# Patient Record
Sex: Female | Born: 1970
Health system: Southern US, Community
[De-identification: ages and names within clinical notes are randomized; demographics above are authoritative.]

## PROBLEM LIST (undated history)

## (undated) DIAGNOSIS — L309 Dermatitis, unspecified: Secondary | ICD-10-CM

## (undated) DIAGNOSIS — J45909 Unspecified asthma, uncomplicated: Secondary | ICD-10-CM

## (undated) DIAGNOSIS — Z975 Presence of (intrauterine) contraceptive device: Secondary | ICD-10-CM

## (undated) DIAGNOSIS — T7840XA Allergy, unspecified, initial encounter: Secondary | ICD-10-CM

## (undated) HISTORY — DX: Unspecified asthma, uncomplicated: J45.909

## (undated) HISTORY — DX: Allergy, unspecified, initial encounter: T78.40XA

## (undated) HISTORY — DX: Presence of (intrauterine) contraceptive device: Z97.5

## (undated) HISTORY — PX: COLONOSCOPY: SHX174

## (undated) HISTORY — DX: Dermatitis, unspecified: L30.9

---

## 2001-02-05 ENCOUNTER — Inpatient Hospital Stay (HOSPITAL_COMMUNITY): Admission: AD | Admit: 2001-02-05 | Discharge: 2001-02-07 | Payer: Self-pay | Admitting: Obstetrics & Gynecology

## 2001-07-21 ENCOUNTER — Other Ambulatory Visit: Admission: RE | Admit: 2001-07-21 | Discharge: 2001-07-21 | Payer: Self-pay | Admitting: Obstetrics & Gynecology

## 2002-07-26 ENCOUNTER — Other Ambulatory Visit: Admission: RE | Admit: 2002-07-26 | Discharge: 2002-07-26 | Payer: Self-pay | Admitting: Obstetrics & Gynecology

## 2003-06-11 ENCOUNTER — Inpatient Hospital Stay (HOSPITAL_COMMUNITY): Admission: AD | Admit: 2003-06-11 | Discharge: 2003-06-11 | Payer: Self-pay | Admitting: Obstetrics & Gynecology

## 2003-06-19 ENCOUNTER — Inpatient Hospital Stay (HOSPITAL_COMMUNITY): Admission: AD | Admit: 2003-06-19 | Discharge: 2003-06-23 | Payer: Self-pay | Admitting: Obstetrics and Gynecology

## 2003-06-24 ENCOUNTER — Encounter: Admission: RE | Admit: 2003-06-24 | Discharge: 2003-07-24 | Payer: Self-pay | Admitting: Obstetrics & Gynecology

## 2003-07-19 ENCOUNTER — Other Ambulatory Visit: Admission: RE | Admit: 2003-07-19 | Discharge: 2003-07-19 | Payer: Self-pay | Admitting: Obstetrics & Gynecology

## 2003-07-25 ENCOUNTER — Encounter: Admission: RE | Admit: 2003-07-25 | Discharge: 2003-08-24 | Payer: Self-pay | Admitting: Obstetrics & Gynecology

## 2003-09-24 ENCOUNTER — Encounter: Admission: RE | Admit: 2003-09-24 | Discharge: 2003-10-24 | Payer: Self-pay | Admitting: Obstetrics & Gynecology

## 2004-07-22 ENCOUNTER — Other Ambulatory Visit: Admission: RE | Admit: 2004-07-22 | Discharge: 2004-07-22 | Payer: Self-pay | Admitting: Obstetrics & Gynecology

## 2005-07-29 ENCOUNTER — Other Ambulatory Visit: Admission: RE | Admit: 2005-07-29 | Discharge: 2005-07-29 | Payer: Self-pay | Admitting: Obstetrics & Gynecology

## 2013-03-22 ENCOUNTER — Other Ambulatory Visit: Payer: Self-pay | Admitting: Family Medicine

## 2013-03-22 DIAGNOSIS — R221 Localized swelling, mass and lump, neck: Secondary | ICD-10-CM

## 2013-03-23 ENCOUNTER — Ambulatory Visit
Admission: RE | Admit: 2013-03-23 | Discharge: 2013-03-23 | Disposition: A | Discharge status: Home / Self Care | Payer: BC Managed Care – PPO | Source: Ambulatory Visit | Attending: Family Medicine | Admitting: Family Medicine

## 2013-03-23 DIAGNOSIS — R220 Localized swelling, mass and lump, head: Secondary | ICD-10-CM

## 2013-04-05 ENCOUNTER — Ambulatory Visit (INDEPENDENT_AMBULATORY_CARE_PROVIDER_SITE_OTHER): Payer: BC Managed Care – PPO | Admitting: Internal Medicine

## 2013-04-05 DIAGNOSIS — Z23 Encounter for immunization: Secondary | ICD-10-CM

## 2013-04-05 DIAGNOSIS — Z7184 Encounter for health counseling related to travel: Secondary | ICD-10-CM | POA: Insufficient documentation

## 2013-04-05 DIAGNOSIS — Z7189 Other specified counseling: Secondary | ICD-10-CM

## 2013-04-05 MED ORDER — CHLOROQUINE PHOSPHATE 500 MG PO TABS: 500.0000 mg | ORAL_TABLET | Freq: Every day | ORAL | Status: DC

## 2013-04-05 MED ORDER — CIPROFLOXACIN HCL 500 MG PO TABS: 500.0000 mg | ORAL_TABLET | Freq: Two times a day (BID) | ORAL | Status: DC

## 2013-04-05 NOTE — Progress Notes (Signed)
  Subjective:    Cindy Gross is a 42 y.o. female who presents to the Infectious Disease clinic for travel consultation. Planned departure date: Aug 9          Planned return date: Aug 16 Countries of travel: [place] Areas in country: rural   Accommodations: dorm like setting Purpose of travel: missionary work Prior travel out of [place]: yes Currently ill / Fever: no History of liver or kidney disease: no  Data Review:  vaccines reviewed with her   Review of Systems Hematologic/lymphatic: negative, n/a n/a    Objective:    n/a    Assessment:    No contraindications to travel. None       Plan:    Issues discussed: future shots, insect-borne illnesses, malaria, motion sickness, MVA safety, rabies, safe food/water, traveler's diarrhea, website/handouts for more information, what to do if ill upon return, what to do if ill while there and [place]. Immunizations recommended: Typhoid (parenteral) and none. Malaria prophylaxis: chloroquine given, moderate risk area Traveler's diarrhea prophylaxis: ciprofloxacin.

## 2014-01-30 LAB — HM MAMMOGRAPHY

## 2014-01-30 LAB — HM PAP SMEAR

## 2014-04-04 ENCOUNTER — Ambulatory Visit (INDEPENDENT_AMBULATORY_CARE_PROVIDER_SITE_OTHER): Payer: 59 | Admitting: Family Medicine

## 2014-04-04 ENCOUNTER — Encounter: Payer: Self-pay | Admitting: Family Medicine

## 2014-04-04 VITALS — BP 102/70 | HR 77 | Temp 98.4°F | Ht 61.0 in | Wt 114.5 lb

## 2014-04-04 DIAGNOSIS — J45990 Exercise induced bronchospasm: Secondary | ICD-10-CM

## 2014-04-04 DIAGNOSIS — Z7189 Other specified counseling: Secondary | ICD-10-CM

## 2014-04-04 MED ORDER — LEVALBUTEROL TARTRATE 45 MCG/ACT IN AERO: 1.0000 | INHALATION_SPRAY | RESPIRATORY_TRACT | Status: DC | PRN

## 2014-04-04 NOTE — Patient Instructions (Signed)
I would get a flu shot each fall.   Take care.  Glad to see you.  We'll get your records in the meantime.

## 2014-04-04 NOTE — Progress Notes (Signed)
Pre visit review using our clinic review tool, if applicable. No additional management support is needed unless otherwise documented below in the visit note.  New patient to est care.  Very mild exercise induced asthma, rare SABA use.  Good effect, needs a refill.  Feels well o/w.  No complaints.   tdap 2014 at work  Flu yearly Requesting records.   Has a living will.  Husband, then brother designated if patient were incapacitated.   PMH and SH reviewed  Meds, vitals, and allergies reviewed.   ROS: See HPI.  Otherwise negative.    GEN: nad, alert and oriented HEENT: mucous membranes moist NECK: supple w/o LA CV: rrr. PULM: ctab, no inc wob ABD: +bs EXT: no edema

## 2014-04-05 ENCOUNTER — Encounter: Payer: Self-pay | Admitting: Family Medicine

## 2014-04-05 DIAGNOSIS — J45990 Exercise induced bronchospasm: Secondary | ICD-10-CM | POA: Insufficient documentation

## 2014-04-05 DIAGNOSIS — Z7189 Other specified counseling: Secondary | ICD-10-CM | POA: Insufficient documentation

## 2014-04-05 NOTE — Assessment & Plan Note (Signed)
Rare sx, continue prn xopenex, rx sent.  Requesting records.  Fu prn.  Flu shot in the fall.

## 2014-08-22 ENCOUNTER — Ambulatory Visit (INDEPENDENT_AMBULATORY_CARE_PROVIDER_SITE_OTHER): Payer: 59 | Admitting: Family Medicine

## 2014-08-22 ENCOUNTER — Encounter: Payer: Self-pay | Admitting: Family Medicine

## 2014-08-22 VITALS — BP 102/68 | HR 84 | Temp 98.4°F | Wt 115.5 lb

## 2014-08-22 DIAGNOSIS — R51 Headache: Secondary | ICD-10-CM

## 2014-08-22 DIAGNOSIS — R519 Headache, unspecified: Secondary | ICD-10-CM

## 2014-08-22 NOTE — Patient Instructions (Signed)
Rotate your mattress and try a different pillow.  Let me know if that doesn't help.  Take care. Glad to see you.

## 2014-08-22 NOTE — Progress Notes (Signed)
Pre visit review using our clinic review tool, if applicable. No additional management support is needed unless otherwise documented below in the visit note.  Sx started on 08/13/14.  She woke up at 4:15 with a HA.  She usually doesn't have HAs. This was atypical for her.  She took 2 advil.   She got out of bed and sat in a chair for a while.  She got better after about 30 minutes.  Had a normal day w/o pain, other than being slightly tired from getting up early.  She had another episodes at 4:15 AM on 08/14/14, another HA like the day prev.  HA better/resolved after taking advil.   She has had recurrent HA every AM since then, between 4 and 5 AM.  No other neuro changes.  No vision or gait changes.  No aura, no photophobia.   The HA starts at the R occiput and radiates through her head to the R eye brow.  It doesn't radiate around the scalp.   She hasn't felt likely running but o/w has been doing her regular activities.   Sunday 08/20/14 AM was the worst HA.  She hasn't changed pillows.  No trauma.  No caffeine.  No new changes (ie starting or stopping anything) to cause it.  Meds, vitals, and allergies reviewed.   ROS: See HPI.  Otherwise, noncontributory.  GEN: nad, alert and oriented HEENT: mucous membranes moist, tm wnl, nasal and OP exam wnl NECK: supple w/o LA, not ttp but the site of pain prev is at the R occipital ridge.  CV: rrr.  PULM: ctab, no inc wob EXT: no edema SKIN: no acute rash CN 2-12 wnl B, S/S/DTR wnl x4 Benign nondilated fundus exam B, PERRL

## 2014-08-23 DIAGNOSIS — R51 Headache: Secondary | ICD-10-CM

## 2014-08-23 DIAGNOSIS — R519 Headache, unspecified: Secondary | ICD-10-CM | POA: Insufficient documentation

## 2014-08-23 NOTE — Assessment & Plan Note (Signed)
D/w pt.  No sign of ominous or intracranial pathology.  Benign exam.  Likely occipital trigger, ie occipital neuralgia.  Less likely from neck OA.  We can consider neck films or CT head but in meantime she'll try changing her pillow and rotate her mattress.  She agrees and she'll call back to update me.

## 2015-12-30 ENCOUNTER — Other Ambulatory Visit: Payer: Self-pay | Admitting: Family Medicine

## 2015-12-30 DIAGNOSIS — Z8349 Family history of other endocrine, nutritional and metabolic diseases: Secondary | ICD-10-CM

## 2015-12-30 DIAGNOSIS — Z131 Encounter for screening for diabetes mellitus: Secondary | ICD-10-CM

## 2015-12-30 DIAGNOSIS — Z13220 Encounter for screening for lipoid disorders: Secondary | ICD-10-CM

## 2016-01-01 ENCOUNTER — Other Ambulatory Visit: Payer: 59

## 2016-01-01 ENCOUNTER — Other Ambulatory Visit (INDEPENDENT_AMBULATORY_CARE_PROVIDER_SITE_OTHER): Payer: 59

## 2016-01-01 DIAGNOSIS — Z131 Encounter for screening for diabetes mellitus: Secondary | ICD-10-CM

## 2016-01-01 DIAGNOSIS — Z8349 Family history of other endocrine, nutritional and metabolic diseases: Secondary | ICD-10-CM | POA: Diagnosis not present

## 2016-01-01 DIAGNOSIS — Z13220 Encounter for screening for lipoid disorders: Secondary | ICD-10-CM | POA: Diagnosis not present

## 2016-01-01 LAB — GLUCOSE, RANDOM: Glucose, Bld: 98 mg/dL (ref 70–99)

## 2016-01-01 LAB — LIPID PANEL
CHOL/HDL RATIO: 2
Cholesterol: 156 mg/dL (ref 0–200)
HDL: 72.7 mg/dL (ref >39.00)
LDL CALC: 74 mg/dL (ref 0–99)
NonHDL: 83.78
TRIGLYCERIDES: 51 mg/dL (ref 0.0–149.0)
VLDL: 10.2 mg/dL (ref 0.0–40.0)

## 2016-01-01 LAB — TSH: TSH: 1.06 u[IU]/mL (ref 0.35–4.50)

## 2016-01-08 ENCOUNTER — Encounter: Payer: 59 | Admitting: Family Medicine

## 2016-01-18 ENCOUNTER — Encounter: Payer: 59 | Admitting: Family Medicine

## 2016-01-18 ENCOUNTER — Ambulatory Visit (INDEPENDENT_AMBULATORY_CARE_PROVIDER_SITE_OTHER): Payer: 59 | Admitting: Family Medicine

## 2016-01-18 ENCOUNTER — Encounter: Payer: Self-pay | Admitting: Family Medicine

## 2016-01-18 VITALS — BP 102/70 | HR 74 | Temp 98.5°F | Ht 61.0 in | Wt 116.0 lb

## 2016-01-18 DIAGNOSIS — Z0000 Encounter for general adult medical examination without abnormal findings: Secondary | ICD-10-CM | POA: Diagnosis not present

## 2016-01-18 DIAGNOSIS — J45990 Exercise induced bronchospasm: Secondary | ICD-10-CM

## 2016-01-18 MED ORDER — LEVALBUTEROL TARTRATE 45 MCG/ACT IN AERO: 1.0000 | INHALATION_SPRAY | RESPIRATORY_TRACT | Status: DC | PRN

## 2016-01-18 NOTE — Patient Instructions (Signed)
Take care.  Glad to see you.  

## 2016-01-18 NOTE — Progress Notes (Signed)
Pre visit review using our clinic review tool, if applicable. No additional management support is needed unless otherwise documented below in the visit note.  CPE- See plan.  Routine anticipatory guidance given to patient.  See health maintenance. tdap 2014 at work  Flu yearly at work PNA and shingles not due Mammogram and pap per gyn.   No due for DXA.   Has a living will. Husband, then brother designated if patient were incapacitated.   Working out and good diet.  Colon cancer screening due at 53.   HIV prev done with prenatal labs in 2003.   Very mild exercise induced asthma, rare SABA use. Good effect, needs a refill. Feels well o/w. No complaints.   PMH and SH reviewed  Meds, vitals, and allergies reviewed.   ROS: Per HPI.  Unless specifically indicated otherwise in HPI, the patient denies:  General: fever. Eyes: acute vision changes ENT: sore throat Cardiovascular: chest pain Respiratory: SOB GI: vomiting GU: dysuria Musculoskeletal: acute back pain Derm: acute rash Neuro: acute motor dysfunction Psych: worsening mood Endocrine: polydipsia Heme: bleeding Allergy: hayfever  GEN: nad, alert and oriented HEENT: mucous membranes moist NECK: supple w/o LA CV: rrr. PULM: ctab, no inc wob ABD: soft, +bs EXT: no edema SKIN: no acute rash

## 2016-01-20 DIAGNOSIS — Z0000 Encounter for general adult medical examination without abnormal findings: Secondary | ICD-10-CM | POA: Insufficient documentation

## 2016-01-20 NOTE — Assessment & Plan Note (Signed)
Continue prn SABA.  Rare use.

## 2016-01-20 NOTE — Assessment & Plan Note (Signed)
tdap 2014 at work  Flu yearly at work  PNA and shingles not due  Mammogram and pap per gyn.  No due for DXA.  Has a living will. Husband, then brother designated if patient were incapacitated.  Working out and good diet.  Colon cancer screening due at 77.  HIV prev done with prenatal labs in 2003.

## 2016-01-25 ENCOUNTER — Other Ambulatory Visit: Payer: Self-pay | Admitting: *Deleted

## 2016-01-25 DIAGNOSIS — I83892 Varicose veins of left lower extremities with other complications: Secondary | ICD-10-CM

## 2016-04-15 ENCOUNTER — Encounter: Payer: Self-pay | Admitting: Vascular Surgery

## 2016-04-17 ENCOUNTER — Encounter: Payer: Self-pay | Admitting: Vascular Surgery

## 2016-04-17 ENCOUNTER — Ambulatory Visit (HOSPITAL_COMMUNITY)
Admission: RE | Admit: 2016-04-17 | Discharge: 2016-04-17 | Disposition: A | Discharge status: Home / Self Care | Payer: 59 | Source: Ambulatory Visit | Attending: Vascular Surgery | Admitting: Vascular Surgery

## 2016-04-17 ENCOUNTER — Ambulatory Visit (INDEPENDENT_AMBULATORY_CARE_PROVIDER_SITE_OTHER): Payer: 59 | Admitting: Vascular Surgery

## 2016-04-17 VITALS — BP 106/78 | HR 75 | Ht 61.0 in | Wt 116.0 lb

## 2016-04-17 DIAGNOSIS — I83892 Varicose veins of left lower extremities with other complications: Secondary | ICD-10-CM | POA: Diagnosis not present

## 2016-04-17 DIAGNOSIS — I83812 Varicose veins of left lower extremities with pain: Secondary | ICD-10-CM

## 2016-04-17 NOTE — Progress Notes (Signed)
Patient name: Cindy Gross MRN: [id number] DOB: [birth year] Sex: female  REASON FOR CONSULT: Varicose veins with pain and thrombophlebitis  HPI: Cindy Gross is a 45 y.o. 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 Over the last few years she has developed more heaviness aching and fullness in the left lower extremity. This is exacerbated by exercise. She has worn compression stockings with some relief. She states the legs are better if she elevates them at the end of the evening and for the most part are improved by the next morning. 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 Her symptoms become worse with this. She has had 2 episodes of thrombophlebitis in the past year related to this in the left leg. One of these was in December the last one was in March. The patient overall is very active and states and triathlon events and her exercise has been limited by episodes of thrombophlebitis in her left leg. She denies any family history of varicose veins. She has no prior history of DVT. She has no family history of hypercoagulable state. Other medical problems include mild exercise-induced asthma which has been stable.  Past Medical History:  Diagnosis Date  . Asthma    mild exercise induced  . IUD (intrauterine device) in place   . NSVD (normal spontaneous vaginal delivery)    Past Surgical History:  Procedure Laterality Date  . CESAREAN SECTION      Family History  Problem Relation Age of Onset  . Hypertension Mother   . Thyroid disease Mother   . Heart disease Father     cabg  . Hyperlipidemia Father   . Hypertension Father   . Breast cancer Paternal Grandmother   . Colon cancer Neg Hx     SOCIAL HISTORY: Social History   Social History  . Marital status: Married    Spouse name: N/A  . Number of children: N/A  . Years of education: N/A   Occupational History  . Not on file.   Social History  Main Topics  . Smoking status: Never Smoker  . Smokeless tobacco: Never Used  . Alcohol use No  . Drug use: No  . Sexual activity: Not on file   Other Topics Concern  . 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    Has done overseas mission work    No Known Allergies  Current Outpatient Prescriptions  Medication Sig Dispense Refill  . levalbuterol (XOPENEX HFA) 45 MCG/ACT inhaler Inhale 1-2 puffs into the lungs every 4 (four) hours as needed for wheezing or shortness of breath. (Patient not taking: Reported on 04/17/2016) 1 Inhaler 12   No current facility-administered medications for this visit.     ROS:   General:  No weight loss, Fever, chills  HEENT: No recent headaches, no nasal bleeding, no visual changes, no sore throat  Neurologic: No dizziness, blackouts, seizures. No recent symptoms of stroke or mini- stroke. No recent episodes of slurred speech, or temporary blindness.  Cardiac: No recent episodes of chest pain/pressure, no shortness of breath at rest.  No shortness of breath with exertion.  Denies history of atrial fibrillation or irregular heartbeat  Vascular:  No history of rest pain in feet.  No history of claudication.  No history of non-healing ulcer, No history of DVT   Pulmonary: No home oxygen, no productive cough, no hemoptysis,  No asthma or wheezing  Musculoskeletal:  [ ]  Arthritis, [ ]  Low back pain,  [ ]  Joint pain  Hematologic:No history of hypercoagulable state.  No history of easy bleeding.  No history of anemia  Gastrointestinal: No hematochezia or melena,  No gastroesophageal reflux, no trouble swallowing  Urinary: [ ]  chronic Kidney disease, [ ]  on HD - [ ]  MWF or [ ]  TTHS, [ ]  Burning with urination, [ ]  Frequent urination, [ ]  Difficulty urinating;     Skin: No rashes  Psychological: No history of anxiety,  No history of depression   Physical Examination  Vitals:   04/17/16 0926  BP: 106/78  Pulse: 75  SpO2: 100%  Weight: 116 lb (52.6 kg)  Height: 5\' 1"  (1.549 m)    Body mass index is 21.92 kg/m.  General:  Alert and oriented, no acute distress HEENT: Normal Neck: No bruit or JVD Pulmonary: Clear to auscultation bilaterally Cardiac: Regular Rate and Rhythm without murmur Abdomen: Soft, non-tender, non-distended, no mass Skin: No rash, large cluster of varicosities over the left pretibial region which is the reason she has had thrombophlebitis in several times. Veins very in diameter from 2-4 mm. There are several clusters of reticular and spider-type varicosities surrounding these as well. Extremity Pulses:  2+ radial, brachial, femoral, dorsalis pedis, posterior tibial pulses bilaterally Musculoskeletal: No deformity or edema  Neurologic: Upper and lower extremity motor 5/5 and symmetric  DATA:  Patient had a left lower extremity venous duplex exam which showed diffuse reflux throughout the entire left greater saphenous vein and saphenofemoral junction. She had a competent deep veins. Vein diameter was 4-8 mm.  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   PLAN:  #1 are prescribed and increasing compression for the patient's lower extremities going to 30 mmHg today. #2 the patient will follow-up in 3 months time for consideration of laser ablation of her left lower extremity.   Ruta Hinds, MD [facility] Office: [phone] Pager: [phone]

## 2016-04-30 LAB — HM PAP SMEAR

## 2016-05-27 ENCOUNTER — Encounter: Payer: Self-pay | Admitting: Family Medicine

## 2016-06-25 ENCOUNTER — Other Ambulatory Visit: Payer: Self-pay

## 2016-06-25 MED ORDER — LEVALBUTEROL TARTRATE 45 MCG/ACT IN AERO: 1.0000 | INHALATION_SPRAY | RESPIRATORY_TRACT | 12 refills | Status: DC | PRN

## 2016-06-25 MED FILL — XOPENEX HFA 45 MCG INHALER: 45 | 30 days supply | Qty: 15 | Fill #0

## 2016-06-25 NOTE — Telephone Encounter (Signed)
Pt left v/m that [facility] outpt pharmacy does not have Xopenex inhaler on file. Spoke with Heidi at [facility] and Medication phoned to [facility] as instructed. Pt voiced understanding.

## 2016-07-22 ENCOUNTER — Ambulatory Visit: Payer: 59 | Admitting: Vascular Surgery

## 2017-03-25 LAB — HM PAP SMEAR

## 2017-03-26 ENCOUNTER — Other Ambulatory Visit: Payer: Self-pay | Admitting: Obstetrics

## 2017-03-26 DIAGNOSIS — N63 Unspecified lump in unspecified breast: Secondary | ICD-10-CM

## 2017-03-27 ENCOUNTER — Other Ambulatory Visit: Payer: Self-pay | Admitting: Obstetrics

## 2017-03-27 DIAGNOSIS — N63 Unspecified lump in unspecified breast: Secondary | ICD-10-CM

## 2017-03-31 ENCOUNTER — Ambulatory Visit
Admission: RE | Admit: 2017-03-31 | Discharge: 2017-03-31 | Disposition: A | Discharge status: Home / Self Care | Payer: Managed Care, Other (non HMO) | Source: Ambulatory Visit | Attending: Obstetrics | Admitting: Obstetrics

## 2017-03-31 DIAGNOSIS — N63 Unspecified lump in unspecified breast: Secondary | ICD-10-CM

## 2017-04-02 ENCOUNTER — Other Ambulatory Visit: Payer: 59

## 2017-04-09 ENCOUNTER — Encounter: Payer: Self-pay | Admitting: Family Medicine

## 2018-08-13 ENCOUNTER — Telehealth: Payer: Self-pay | Admitting: Family Medicine

## 2018-08-13 NOTE — Telephone Encounter (Signed)
Pt called office wanting to schedule a physical with Dr.Duncan. I offered the soonest appointment he had for a CPX which was in March. Pt stated she needed it to be a Wednesday because that's her day off. I explained that Dr.Duncan is out of the office on Wednesday's. She asked if she could see another provider for a physical and I explained that it would need to be her PCP. Pt stated she just might look for a new practice. I did let the pt know I would send a message to Dr.Duncan to see if we could accommodate her in any way and that someone would reach back out. Pt still verbalized that she would look for another practice because she feels just because Dr.Duncan is out of the office on Wednesday doesn't mean she can't see another provider for a physical. Please advise

## 2018-08-15 ENCOUNTER — Encounter: Payer: Self-pay | Admitting: Family Medicine

## 2018-08-15 NOTE — Telephone Encounter (Signed)
This patient is a completely reliable and reasonable person.  I think we should try to offer some accommodation given her work schedule.  I am not in the clinic on Wednesdays.  We usually try to keep physicals with the PCP.  If another provider is willing to schedule her for a physical, then that is one option.  The other option is to schedule it on some other day of the work with me, or have her see a different provider/clinic.  I do not have availability on Wednesdays anymore, unfortunately.  I routed this to Leticia Penna for help/input.  I thank all involved.

## 2018-08-17 NOTE — Telephone Encounter (Signed)
As Dr. Damita Dunnings is no longer here on Wednesdays, patient may want to consider another provider due to her scheduling needs.    We certainly can ask another provider if they would be willing to do her annual exam on a Wednesday, but my concern is where does this leave her moving forward?  If she can only schedule appointments on Wednesdays then we may better support the patient by offering a transfer of care to a provider with a Wed schedule.  Please call patient and discuss her options and ask how she would like to move forward.  If transferring, please review available providers and ask if preference.  Send them and Dr. Damita Dunnings the notice of transfer and once transfer acceptance, then make appointment for patient.  Thanks!

## 2018-08-18 NOTE — Telephone Encounter (Signed)
See below, let me know what you hear.  Either way, I wish this patient the best.  Thanks.

## 2018-08-18 NOTE — Telephone Encounter (Signed)
FYI, transfer to you due to me not being in clinic on Wednesdays.  Kind and reliable patient.  Thanks.

## 2018-08-18 NOTE — Telephone Encounter (Signed)
I spoke to Cindy Gross and explained it is best for Cindy Gross to see pcp for physicals. She said a tranfer of care was fine with her, she values Dr. Damita Dunnings but was willing to transfer for Wed appts. I scheduled her a TOC appt 09/08/18. She is requesting a cpe but was advised this was only if time allows and there were not any issues discussed. Cindy Gross understood.

## 2018-09-08 ENCOUNTER — Ambulatory Visit (INDEPENDENT_AMBULATORY_CARE_PROVIDER_SITE_OTHER): Payer: Managed Care, Other (non HMO) | Admitting: Family Medicine

## 2018-09-08 ENCOUNTER — Encounter: Payer: Self-pay | Admitting: Family Medicine

## 2018-09-08 VITALS — BP 102/74 | HR 87 | Temp 98.3°F | Ht 61.0 in | Wt 122.5 lb

## 2018-09-08 DIAGNOSIS — L309 Dermatitis, unspecified: Secondary | ICD-10-CM | POA: Diagnosis not present

## 2018-09-08 DIAGNOSIS — Z0001 Encounter for general adult medical examination with abnormal findings: Secondary | ICD-10-CM | POA: Diagnosis not present

## 2018-09-08 DIAGNOSIS — Z0000 Encounter for general adult medical examination without abnormal findings: Secondary | ICD-10-CM

## 2018-09-08 NOTE — Progress Notes (Signed)
Annual Exam   Chief Complaint:  Chief Complaint  Patient presents with  . Transfer of Care    from Dr. Damita Dunnings due to availability.  . Annual Exam    does see OBGYN and last visit was in November 2019 with them.    History of Present Illness:  [name] is a 48 y.o. No obstetric history on file. who LMP was No LMP recorded. (Menstrual status: IUD)., presents today for her annual examination.     IUD with no regular cycles  She is single partner, contraception - IUD.   Cancer Screening:  Cervical Cancer Screening:  Last Pap:   November 2019 Results were: no abnormalities /neg HPV DNA Hx of STDs: none  Breast Cancer Screening There is FH of breast cancer. There is no FH of ovarian cancer. BRCA screening Not Indicated.  Screening done by OB/GYN   Nutrition She does get adequate calcium and Vitamin D in her diet. Diet: healthy, vegetarian  Safety The patient wears seatbelts: yes.     The patient feels safe at home and in their relationships: yes.  Weight Wt Readings from Last 3 Encounters:  09/08/18 122 lb 8 oz (55.6 kg)  04/17/16 116 lb (52.6 kg)  01/18/16 116 lb (52.6 kg)   Patient has normal BMI  BMI Readings from Last 1 Encounters:  09/08/18 23.15 kg/m     Chronic disease screening Blood pressure monitoring:  BP Readings from Last 3 Encounters:  09/08/18 102/74  04/17/16 106/78  01/18/16 102/70    Lipid Monitoring: Indication for screening: age >77, obesity, diabetes, family hx, CV risk factors.  Lipid screening: up to date  Lab Results  Component Value Date   CHOL 156 01/01/2016   HDL 72.70 01/01/2016   LDLCALC 74 01/01/2016   TRIG 51.0 01/01/2016   CHOLHDL 2 01/01/2016     Diabetes Screening: overweight, family hx, PCOS, hx of gestational diabetes, at risk ethnicity Diabetes Screening screening: No  No results found for: HGBA1C   Past Medical History:  Diagnosis Date  . Asthma    mild exercise induced  . IUD  (intrauterine device) in place   . NSVD (normal spontaneous vaginal delivery)     Past Surgical History:  Procedure Laterality Date  . CESAREAN SECTION      Prior to Admission medications   Medication Sig Start Date End Date Taking? Authorizing Provider  levalbuterol [facility] HFA) 45 MCG/ACT inhaler Inhale 1-2 puffs into the lungs every 4 (four) hours as needed for wheezing or shortness of breath. 06/25/16   Tonia Ghent, MD    No Known Allergies  Gynecologic History: No LMP recorded. (Menstrual status: IUD).  Obstetric History: No obstetric history on file.  Social History   Socioeconomic History  . Marital status: Married    Spouse name: Cindy Gross  . Number of children: 2  . Years of education: Masters Degree  . Highest education level: Not on file  Occupational History  . Not on file  Social Needs  . Financial resource strain: Not hard at all  . Food insecurity:    Worry: Not on file    Inability: Not on file  . Transportation needs:    Medical: Not on file    Non-medical: Not on file  Tobacco Use  . Smoking status: Never Smoker  . Smokeless tobacco: Never Used  Substance and Sexual Activity  . Alcohol use: Yes    Comment: 1-2 drinks a week  . Drug use: No  .  Sexual activity: Yes    Birth control/protection: I.U.D.  Lifestyle  . Physical activity:    Days per week: Not on file    Minutes per session: Not on file  . Stress: Not on file  Relationships  . Social connections:    Talks on phone: Not on file    Gets together: Not on file    Attends religious service: Not on file    Active member of club or organization: Not on file    Attends meetings of clubs or organizations: Not on file    Relationship status: Not on file  . Intimate partner violence:    Fear of current or ex partner: Not on file    Emotionally abused: Not on file    Physically abused: Not on file    Forced sexual activity: Not on file  Other Topics Concern  . Not on file  Social  History Narrative   Married - Cindy Gross - now permanently disabled    2 sons - Will and Wellsite geologist (Will is graduating high school in 2020   Physician's assistant, [employer]   Exercise: Did 86 triathlons, frequent exercise, half marathon   [employer]. 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 Hypertension Mother        lost weight an no longer needs medicine  . Hypothyroidism Mother   . Heart disease Father        cabg  . Hyperlipidemia Father   . Hypertension Father   . Breast cancer Paternal Grandmother   . Heart failure Paternal Grandmother   . CAD Paternal Grandfather   . Heart attack Paternal Grandfather 81  . Leukemia Paternal Grandfather   . Colon cancer Neg Hx     Review of Systems  Constitutional: Negative for chills and fever.  HENT: Positive for congestion. Negative for sinus pain.   Eyes: Negative for blurred vision and redness.  Respiratory: Positive for cough. Negative for sputum production.   Cardiovascular: Negative for chest pain.  Gastrointestinal: Negative for constipation, diarrhea, heartburn, nausea and vomiting.  Genitourinary: Negative for dysuria and urgency.  Musculoskeletal: Negative for myalgias.  Skin: Negative for rash.  Neurological: Negative for dizziness, weakness and headaches.  Endo/Heme/Allergies: Negative for polydipsia.  Psychiatric/Behavioral: Negative for depression. The patient is not nervous/anxious.      Physical Exam BP 102/74   Pulse 87   Temp 98.3 F (36.8 C)   Ht _0  (1.549 m)   Wt 122 lb 8 oz (55.6 kg)   SpO2 99%   BMI 23.15 kg/m    BP Readings from Last 3 Encounters:  09/08/18 102/74  04/17/16 106/78  01/18/16 102/70      Physical Exam Constitutional:      General: She is not in acute distress.    Appearance: She is  well-developed. She is not diaphoretic.  HENT:     Head: Normocephalic and atraumatic.     Right Ear: External ear normal.     Left Ear: External ear normal.     Nose: Nose normal.  Eyes:     General: No scleral icterus.    Conjunctiva/sclera: Conjunctivae normal.  Neck:     Musculoskeletal: Neck supple.  Cardiovascular:     Rate and Rhythm: Normal rate and regular  rhythm.     Heart sounds: No murmur.  Pulmonary:     Effort: Pulmonary effort is normal. No respiratory distress.     Breath sounds: Normal breath sounds. No wheezing.  Abdominal:     General: Bowel sounds are normal. There is no distension.     Palpations: Abdomen is soft. There is no mass.     Tenderness: There is no abdominal tenderness. There is no guarding or rebound.  Musculoskeletal: Normal range of motion.  Lymphadenopathy:     Cervical: No cervical adenopathy.  Skin:    General: Skin is warm and dry.     Capillary Refill: Capillary refill takes less than 2 seconds.     Comments: Patches of eczema on face  Neurological:     Mental Status: She is alert and oriented to person, place, and time.     Deep Tendon Reflexes: Reflexes normal.  Psychiatric:        Behavior: Behavior normal.        Results: AUDIT Questionnaire (screen for alcoholism):   PHQ-9:     Office Visit from 09/08/2018 in [place] [facility]  PHQ-9 Total Score  5         Assessment: 48 y.o. No obstetric history on file. female here for routine annual physical examination.  Plan: Problem List Items Addressed This Visit      Musculoskeletal and Integument   Severe eczema      Screening: -- Blood pressure screen normal -- Mammogram - up to dated, followed by OB/GYN -- Weight screening: normal -- Depression screening (PHQ-9): positive, in therapy -- Nutrition: normal -- cholesterol screening: not due for screening -- osteoporosis screening: not due -- tobacco screening: not using -- alcohol screening: Low  risk usage -- family history of breast cancer screening: up to date. Low risk -- no evidence of domestic violence or intimate partner violence. -- pap smear per OB/GYN per ASCCP guidelines -- flu vaccine up to date -- TDAP  Up to date -- recent eye exam normal   Lesleigh Noe

## 2019-01-07 ENCOUNTER — Encounter: Payer: Self-pay | Admitting: Family Medicine

## 2019-01-07 NOTE — Telephone Encounter (Signed)
Spoke with patient and discussed that we can do virtual appointment with urine sample drop off prior today if she is able to. Advised that Dr. Einar Pheasant is not here today but anyone else can see patient. Discussed several options of getting urine sample done. Patient verbalized understanding and will call back after looking at her schedule for today, she is working from home and needs to see when can she step out to come to our office. Advised patient that we could let her collect urine sample here when she comes.

## 2019-07-19 ENCOUNTER — Telehealth: Payer: Self-pay | Admitting: Family Medicine

## 2019-07-19 ENCOUNTER — Encounter: Payer: Managed Care, Other (non HMO) | Admitting: Family Medicine

## 2019-07-19 DIAGNOSIS — Z1211 Encounter for screening for malignant neoplasm of colon: Secondary | ICD-10-CM

## 2019-07-19 NOTE — Telephone Encounter (Signed)
Pt r/s her cpx to 09/19/2019.  She wanted to know since she turned 48 in sept.  Does she need to start the referral process for a screening colonoscopy or wait to discuss with you in Jan.

## 2019-07-19 NOTE — Telephone Encounter (Signed)
Will put referral in now.   MyChart to patient.

## 2019-08-01 ENCOUNTER — Encounter: Payer: Self-pay | Admitting: Gastroenterology

## 2019-08-01 ENCOUNTER — Encounter: Payer: Managed Care, Other (non HMO) | Admitting: Family Medicine

## 2019-08-09 ENCOUNTER — Other Ambulatory Visit: Payer: Self-pay

## 2019-08-09 ENCOUNTER — Ambulatory Visit (AMBULATORY_SURGERY_CENTER): Payer: Self-pay

## 2019-08-09 ENCOUNTER — Encounter: Payer: Self-pay | Admitting: Gastroenterology

## 2019-08-09 VITALS — Temp 97.1°F | Ht 61.0 in | Wt 124.0 lb

## 2019-08-09 DIAGNOSIS — Z1211 Encounter for screening for malignant neoplasm of colon: Secondary | ICD-10-CM

## 2019-08-09 MED ORDER — NA SULFATE-K SULFATE-MG SULF 17.5-3.13-1.6 GM/177ML PO SOLN: 1.0000 | Freq: Once | ORAL | 0 refills | Status: AC

## 2019-08-09 NOTE — Progress Notes (Signed)
Denies allergies to eggs or soy products. Denies complication of anesthesia or sedation. Denies use of weight loss medication. Denies use of O2.   Emmi instructions given for colonoscopy.   Covid screening is scheduled for 08/17/19 @ 12:30 Pm.   Patient states that she is in a [place] drug study. The  medication is Abrocitinib ( JAK-1 inhibitor )  The medication is for severe eczema. Thy states that she knows that she is on the medication but she is not sure which dose that she is taking.

## 2019-08-11 LAB — BASIC METABOLIC PANEL
BUN: 12 (ref 4–21)
Creatinine: 0.7 (ref 0.5–1.1)
Glucose: 95
Potassium: 4.1 (ref 3.4–5.3)
Sodium: 140 (ref 137–147)

## 2019-08-11 LAB — HEPATIC FUNCTION PANEL
ALT: 20 (ref 7–35)
AST: 28 (ref 13–35)

## 2019-08-11 LAB — CBC AND DIFFERENTIAL
HCT: 42 (ref 36–46)
Hemoglobin: 14 (ref 12.0–16.0)
Platelets: 176 (ref 150–399)
WBC: 4.1

## 2019-08-11 LAB — LIPID PANEL
Cholesterol: 195 (ref 0–200)
HDL: 108 — AB (ref 35–70)
LDL Cholesterol: 77
Triglycerides: 48 (ref 40–160)

## 2019-08-11 LAB — COMPREHENSIVE METABOLIC PANEL: Calcium: 9.5 (ref 8.7–10.7)

## 2019-08-17 ENCOUNTER — Ambulatory Visit (INDEPENDENT_AMBULATORY_CARE_PROVIDER_SITE_OTHER): Payer: Managed Care, Other (non HMO)

## 2019-08-17 ENCOUNTER — Other Ambulatory Visit: Payer: Self-pay | Admitting: Gastroenterology

## 2019-08-17 DIAGNOSIS — Z1159 Encounter for screening for other viral diseases: Secondary | ICD-10-CM

## 2019-08-18 LAB — SARS CORONAVIRUS 2 (TAT 6-24 HRS): SARS Coronavirus 2: NEGATIVE

## 2019-08-22 ENCOUNTER — Encounter: Payer: Managed Care, Other (non HMO) | Admitting: Gastroenterology

## 2019-08-22 ENCOUNTER — Encounter: Payer: Self-pay | Admitting: Family Medicine

## 2019-08-23 ENCOUNTER — Ambulatory Visit (AMBULATORY_SURGERY_CENTER): Payer: Managed Care, Other (non HMO) | Admitting: Gastroenterology

## 2019-08-23 ENCOUNTER — Other Ambulatory Visit: Payer: Self-pay

## 2019-08-23 ENCOUNTER — Encounter: Payer: Self-pay | Admitting: Gastroenterology

## 2019-08-23 VITALS — BP 114/74 | HR 80 | Temp 98.0°F | Resp 15 | Ht 61.0 in | Wt 124.0 lb

## 2019-08-23 DIAGNOSIS — D123 Benign neoplasm of transverse colon: Secondary | ICD-10-CM

## 2019-08-23 DIAGNOSIS — D127 Benign neoplasm of rectosigmoid junction: Secondary | ICD-10-CM

## 2019-08-23 DIAGNOSIS — D120 Benign neoplasm of cecum: Secondary | ICD-10-CM

## 2019-08-23 DIAGNOSIS — D125 Benign neoplasm of sigmoid colon: Secondary | ICD-10-CM

## 2019-08-23 DIAGNOSIS — Z1211 Encounter for screening for malignant neoplasm of colon: Secondary | ICD-10-CM

## 2019-08-23 MED ORDER — SODIUM CHLORIDE 0.9 % IV SOLN: 500.0000 mL | Freq: Once | INTRAVENOUS | Status: DC

## 2019-08-23 NOTE — Progress Notes (Signed)
Called to room to assist during endoscopic procedure.  Patient ID and intended procedure confirmed with present staff. Received instructions for my participation in the procedure from the performing physician.  

## 2019-08-23 NOTE — Progress Notes (Signed)
Report given to PACU, vss 

## 2019-08-23 NOTE — Patient Instructions (Signed)
Handouts given:  Hemorrhoids, Diverticulosis, Polyps Resume previous diet Continue present medications Await pathology    YOU HAD AN ENDOSCOPIC PROCEDURE TODAY AT [facility]:   Refer to the procedure report that was given to you for any specific questions about what was found during the examination.  If the procedure report does not answer your questions, please call your gastroenterologist to clarify.  If you requested that your care partner not be given the details of your procedure findings, then the procedure report has been included in a sealed envelope for you to review at your convenience later.  YOU SHOULD EXPECT: Some feelings of bloating in the abdomen. Passage of more gas than usual.  Walking can help get rid of the air that was put into your GI tract during the procedure and reduce the bloating. If you had a lower endoscopy (such as a colonoscopy or flexible sigmoidoscopy) you may notice spotting of blood in your stool or on the toilet paper. If you underwent a bowel prep for your procedure, you may not have a normal bowel movement for a few days.  Please Note:  You might notice some irritation and congestion in your nose or some drainage.  This is from the oxygen used during your procedure.  There is no need for concern and it should clear up in a day or so.  SYMPTOMS TO REPORT IMMEDIATELY:   Following lower endoscopy (colonoscopy or flexible sigmoidoscopy):  Excessive amounts of blood in the stool  Significant tenderness or worsening of abdominal pains  Swelling of the abdomen that is new, acute  Fever of 100F or higher   For urgent or emergent issues, a gastroenterologist can be reached at any hour by calling [phone].   DIET:  We do recommend a small meal at first, but then you may proceed to your regular diet.  Drink plenty of fluids but you should avoid alcoholic beverages for 24 hours.  ACTIVITY:  You should plan to take it easy for the rest of  today and you should NOT DRIVE or use heavy machinery until tomorrow (because of the sedation medicines used during the test).    FOLLOW UP: Our staff will call the number listed on your records 48-72 hours following your procedure to check on you and address any questions or concerns that you may have regarding the information given to you following your procedure. If we do not reach you, we will leave a message.  We will attempt to reach you two times.  During this call, we will ask if you have developed any symptoms of COVID 19. If you develop any symptoms (ie: fever, flu-like symptoms, shortness of breath, cough etc.) before then, please call [phone].  If you test positive for Covid 19 in the 2 weeks post procedure, please call and report this information to [place].    If any biopsies were taken you will be contacted by phone or by letter within the next 1-3 weeks.  Please call us at [phone] if you have not heard about the biopsies in 3 weeks.    SIGNATURES/CONFIDENTIALITY: You and/or your care partner have signed paperwork which will be entered into your electronic medical record.  These signatures attest to the fact that that the information above on your After Visit Summary has been reviewed and is understood.  Full responsibility of the confidentiality of this discharge information lies with you and/or your care-partner.

## 2019-08-23 NOTE — Op Note (Signed)
[facility] Patient Name: Cindy Gross Procedure Date: 08/23/2019 2:00 PM MRN: [id number] Endoscopist: Remo Lipps P. Havery Moros , MD Age: 48 Referring MD:  Date of Birth: [birth year] Gender: Female Account #: [id number] Procedure:                Colonoscopy Indications:              Screening for colorectal malignant neoplasm, This                            is the patient's first colonoscopy Medicines:                Monitored Anesthesia Care Procedure:                Pre-Anesthesia Assessment:                           - Prior to the procedure, a History and Physical                            was performed, and patient medications and                            allergies were reviewed. The patient's tolerance of                            previous anesthesia was also reviewed. The risks                            and benefits of the procedure and the sedation                            options and risks were discussed with the patient.                            All questions were answered, and informed consent                            was obtained. Prior Anticoagulants: The patient has                            taken no previous anticoagulant or antiplatelet                            agents. ASA Grade Assessment: II - A patient with                            mild systemic disease. After reviewing the risks                            and benefits, the patient was deemed in                            satisfactory condition to undergo the procedure.  After obtaining informed consent, the colonoscope                            was passed under direct vision. Throughout the                            procedure, the patient's blood pressure, pulse, and                            oxygen saturations were monitored continuously. The                            Colonoscope was introduced through the anus and                            advanced to the  the cecum, identified by                            appendiceal orifice and ileocecal valve. The                            colonoscopy was performed without difficulty. The                            patient tolerated the procedure well. The quality                            of the bowel preparation was good. The ileocecal                            valve, appendiceal orifice, and rectum were                            photographed. Scope In: 2:05:37 PM Scope Out: 2:37:13 PM Scope Withdrawal Time: 0 hours 26 minutes 56 seconds  Total Procedure Duration: 0 hours 31 minutes 36 seconds  Findings:                 The perianal and digital rectal examinations were                            normal.                           A 10 mm polyp was found in the cecum. The polyp was                            flat. The polyp was removed with a cold snare.                            Resection and retrieval were complete.                           A 4 to 5 mm polyp was found in the splenic flexure.  The polyp was flat. The polyp was removed with a                            cold snare. Resection and retrieval were complete.                           A 4 mm polyp was found in the sigmoid colon. The                            polyp was sessile. The polyp was removed with a                            cold snare. Resection and retrieval were complete.                           A 3 mm polyp was found in the recto-sigmoid colon.                            The polyp was flat. The polyp was removed with a                            cold snare. Resection and retrieval were complete.                           A few small-mouthed diverticula were found in the                            ascending colon.                           The colon was tortuous.                           Internal hemorrhoids were found during                            retroflexion. The hemorrhoids were small.                            The exam was otherwise without abnormality. Complications:            No immediate complications. Estimated blood loss:                            Minimal. Estimated Blood Loss:     Estimated blood loss was minimal. Impression:               - One 10 mm polyp in the cecum, removed with a cold                            snare. Resected and retrieved.                           - One 4 to 5 mm polyp at the splenic flexure,  removed with a cold snare. Resected and retrieved.                           - One 4 mm polyp in the sigmoid colon, removed with                            a cold snare. Resected and retrieved.                           - One 3 mm polyp at the recto-sigmoid colon,                            removed with a cold snare. Resected and retrieved.                           - Diverticulosis in the ascending colon.                           - Tortuous colon.                           - Internal hemorrhoids.                           - The examination was otherwise normal. Recommendation:           - Patient has a contact number available for                            emergencies. The signs and symptoms of potential                            delayed complications were discussed with the                            patient. Return to normal activities tomorrow.                            Written discharge instructions were provided to the                            patient.                           - Resume previous diet.                           - Continue present medications.                           - Await pathology results. Remo Lipps P. Havery Moros, MD 08/23/2019 2:42:52 PM This report has been signed electronically.

## 2019-08-25 ENCOUNTER — Telehealth: Payer: Self-pay | Admitting: *Deleted

## 2019-08-25 NOTE — Telephone Encounter (Signed)
1. Have you developed a fever since your procedure? no  2.   Have you had an respiratory symptoms (SOB or cough) since your procedure? no  3.   Have you tested positive for COVID 19 since your procedure no  4.   Have you had any family members/close contacts diagnosed with the COVID 19 since your procedure?  no   If yes to any of these questions please route to Joylene John, RN and Alphonsa Gin, Therapist, sports.     Follow up Call-  Call back number 08/23/2019  Post procedure Call Back phone  # [phone]  Permission to leave phone message Yes  Some recent data might be hidden     Patient questions:  Do you have a fever, pain , or abdominal swelling? No. Pain Score  0 *  Have you tolerated food without any problems? Yes.    Have you been able to return to your normal activities? Yes.    Do you have any questions about your discharge instructions: Diet   No. Medications  No. Follow up visit  No.  Do you have questions or concerns about your Care? No.  Actions: * If pain score is 4 or above: No action needed, pain <4.

## 2019-09-06 ENCOUNTER — Other Ambulatory Visit: Payer: Self-pay | Admitting: Obstetrics

## 2019-09-06 DIAGNOSIS — R928 Other abnormal and inconclusive findings on diagnostic imaging of breast: Secondary | ICD-10-CM

## 2019-09-06 LAB — HM MAMMOGRAPHY

## 2019-09-07 ENCOUNTER — Ambulatory Visit
Admission: RE | Admit: 2019-09-07 | Discharge: 2019-09-07 | Disposition: A | Discharge status: Home / Self Care | Payer: Managed Care, Other (non HMO) | Source: Ambulatory Visit | Attending: Obstetrics | Admitting: Obstetrics

## 2019-09-07 ENCOUNTER — Other Ambulatory Visit: Payer: Self-pay

## 2019-09-07 DIAGNOSIS — R928 Other abnormal and inconclusive findings on diagnostic imaging of breast: Secondary | ICD-10-CM

## 2019-09-07 LAB — HM PAP SMEAR: HM Pap smear: NEGATIVE

## 2019-09-19 ENCOUNTER — Encounter: Payer: Self-pay | Admitting: Family Medicine

## 2019-09-19 ENCOUNTER — Other Ambulatory Visit: Payer: Self-pay

## 2019-09-19 ENCOUNTER — Ambulatory Visit (INDEPENDENT_AMBULATORY_CARE_PROVIDER_SITE_OTHER): Payer: Managed Care, Other (non HMO) | Admitting: Family Medicine

## 2019-09-19 VITALS — BP 114/62 | HR 72 | Temp 97.7°F | Resp 12 | Ht 61.5 in | Wt 123.8 lb

## 2019-09-19 DIAGNOSIS — Z0000 Encounter for general adult medical examination without abnormal findings: Secondary | ICD-10-CM | POA: Diagnosis not present

## 2019-09-19 NOTE — Progress Notes (Signed)
Annual Exam   Chief Complaint:  Chief Complaint  Patient presents with  . Annual Exam    had gyn exam on 09/05/2019.    History of Present Illness:  [name] is a 49 y.o. No obstetric history on file. who LMP was No LMP recorded. (Menstrual status: IUD)., presents today for her annual examination.     Nutrition She does get adequate calcium and Vitamin D in her diet. Diet: mostly vegetarian - cheese a protein Exercise: regular exercise - still running - 30th triathlon for 48th birthday  Safety The patient wears seatbelts: yes.     The patient feels safe at home and in their relationships: yes.   Menstrual No menses with IUD  GYN No partner currently Hx of STDs: none   Cervical Cancer Screening:   Last Pap:   January 2021 Results were: pending /neg HPV DNA   Breast Cancer Screening The patient does want a mammogram this year.   -- Just had a mammogram with magnified views and [place] which were normal -- Return in 1 years   Colon Cancer Screening Up to date on colonoscopy - repeat in 3 years    Weight Wt Readings from Last 3 Encounters:  09/19/19 123 lb 12 oz (56.1 kg)  08/23/19 124 lb (56.2 kg)  08/09/19 124 lb (56.2 kg)   Patient has normal BMI  BMI Readings from Last 1 Encounters:  09/19/19 23.00 kg/m     Chronic disease screening Blood pressure monitoring:  BP Readings from Last 3 Encounters:  09/19/19 114/62  08/23/19 114/74  09/08/18 102/74    Lipid Monitoring: Indication for screening: age >20, obesity, diabetes, family hx, CV risk factors.  Lipid screening: Not Indicated  Lab Results  Component Value Date   CHOL 195 08/11/2019   HDL 108 (A) 08/11/2019   LDLCALC 77 08/11/2019   TRIG 48 08/11/2019   CHOLHDL 2 01/01/2016   In phase 4 of a drug trial - just does not know what dose   HDL 108 LDL 77 Total 195 TG 48  Diabetes Screening: age >7, overweight, family hx, PCOS, hx of gestational diabetes, at risk  ethnicity Diabetes Screening screening: Yes  Fasting 95  No results found for: HGBA1C   Past Medical History:  Diagnosis Date  . Allergy   . Asthma    mild exercise induced  . Eczema   . IUD (intrauterine device) in place   . NSVD (normal spontaneous vaginal delivery)     Past Surgical History:  Procedure Laterality Date  . CESAREAN SECTION      Prior to Admission medications   Medication Sig Start Date End Date Taking? Authorizing Provider  levalbuterol [facility] HFA) 45 MCG/ACT inhaler Inhale 1-2 puffs into the lungs every 4 (four) hours as needed for wheezing or shortness of breath. Patient not taking: Reported on 08/23/2019 06/25/16   Tonia Ghent, MD  levonorgestrel (MIRENA, 52 MG,) 20 MCG/24HR IUD Mirena 20 mcg/24 hours (5 yrs) 52 mg intrauterine device  Take 1 device by intrauterine route.    [provider]    No Known Allergies  Gynecologic History: No LMP recorded. (Menstrual status: IUD).  Obstetric History: No obstetric history on file.  Social History   Socioeconomic History  . Marital status: Legally Separated    Spouse name: Gwyndolyn Saxon  . Number of children: 2  . Years of education: Masters Degree  . Highest education level: Not on file  Occupational History  . Not on  file  Tobacco Use  . Smoking status: Never Smoker  . Smokeless tobacco: Never Used  Substance and Sexual Activity  . Alcohol use: Yes    Comment: 1-2 drinks a week  . Drug use: No  . Sexual activity: Yes    Birth control/protection: I.U.D.  Other Topics Concern  . Not on file  Social History Narrative   Recently separated from husband Gwyndolyn Saxon   2 sons - Will and Wellsite geologist (Will in college)   Conservation officer, historic buildings, in [place]   Exercise: Did 30 triathlons, frequent exercise, half marathon   [employer]. 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 Difficulty of Paying Living Expenses: Not on file  Food Insecurity:   . Worried About Charity fundraiser in the Last Year: Not on file  . Ran Out of Food in the Last Year: Not on file  Transportation Needs:   . Lack of Transportation (Medical): Not on file  . Lack of Transportation (Non-Medical): Not on file  Physical Activity:   . Days of Exercise per Week: Not on file  . Minutes of Exercise per Session: Not on file  Stress:   . Feeling of Stress : Not on file  Social Connections:   . Frequency of Communication with Friends and Family: Not on file  . Frequency of Social Gatherings with Friends and Family: Not on file  . Attends Religious Services: Not on file  . Active Member of Clubs or Organizations: Not on file  . Attends Archivist Meetings: Not on file  . Marital Status: Not on file  Intimate Partner Violence:   . Fear of Current or Ex-Partner: Not on file  . Emotionally Abused: Not on file  . Physically Abused: Not on file  . Sexually Abused: Not on file    Family History  Problem Relation Age of Onset  . Hypertension Mother        lost weight an no longer needs medicine  . Hypothyroidism Mother   . Heart disease Father        cabg  . Hyperlipidemia Father   . Hypertension Father   . Breast cancer Paternal Grandmother   . Heart failure Paternal Grandmother   . CAD Paternal Grandfather   . Heart attack Paternal Grandfather 81  . Leukemia Paternal Grandfather   . Colon cancer Neg Hx   . Esophageal cancer Neg Hx   . Rectal cancer Neg Hx   . Stomach cancer Neg Hx     Review of Systems  Constitutional: Negative for chills and fever.  HENT: Negative for congestion and sore throat.   Eyes: Negative for blurred vision and double vision.  Respiratory: Negative for shortness of breath.   Cardiovascular: Negative for chest pain.  Gastrointestinal:  Negative for heartburn, nausea and vomiting.  Genitourinary: Negative.   Musculoskeletal: Negative.  Negative for myalgias.  Skin: Negative for rash.  Neurological: Negative for dizziness and headaches.  Endo/Heme/Allergies: Does not bruise/bleed easily.  Psychiatric/Behavioral: Negative for depression. The patient is not nervous/anxious.      Physical Exam BP 114/62   Pulse 72   Temp 97.7 F (36.5 C)   Resp 12   Ht 5' 1.5" (1.562 m)   Wt  123 lb 12 oz (56.1 kg)   SpO2 98%   BMI 23.00 kg/m    BP Readings from Last 3 Encounters:  09/19/19 114/62  08/23/19 114/74  09/08/18 102/74      Physical Exam Constitutional:      General: She is not in acute distress.    Appearance: She is well-developed. She is not diaphoretic.  HENT:     Head: Normocephalic and atraumatic.     Right Ear: External ear normal.     Left Ear: External ear normal.     Nose: Nose normal.     Mouth/Throat:     Mouth: Mucous membranes are moist.     Pharynx: No posterior oropharyngeal erythema.  Eyes:     General: No scleral icterus.    Conjunctiva/sclera: Conjunctivae normal.     Pupils: Pupils are equal, round, and reactive to light.  Cardiovascular:     Rate and Rhythm: Normal rate and regular rhythm.     Heart sounds: No murmur.  Pulmonary:     Effort: Pulmonary effort is normal. No respiratory distress.     Breath sounds: Normal breath sounds. No wheezing.  Abdominal:     General: Bowel sounds are normal. There is no distension.     Palpations: Abdomen is soft. There is no mass.     Tenderness: There is no abdominal tenderness. There is no guarding or rebound.  Musculoskeletal:        General: Normal range of motion.     Cervical back: Neck supple.  Lymphadenopathy:     Cervical: No cervical adenopathy.  Skin:    General: Skin is warm and dry.     Capillary Refill: Capillary refill takes less than 2 seconds.  Neurological:     Mental Status: She is alert and oriented to person, place,  and time.     Deep Tendon Reflexes: Reflexes normal.  Psychiatric:        Behavior: Behavior normal.      Results:  PHQ-9:    Office Visit from 09/08/2018 in [place] at [facility]  PHQ-9 Total Score  5        Assessment: 49 y.o. No obstetric history on file. female here for routine annual physical examination.  Plan: Problem List Items Addressed This Visit    None    Visit Diagnoses    Annual physical exam    -  Primary      Screening: -- Blood pressure screen normal -- cholesterol screening: normal -- Weight screening: normal -- Diabetes Screening: normal -- Nutrition: normal - encouraged continued healthy eating     Psych -- Depression screening (PHQ-9):    Office Visit from 09/08/2018 in [place] at [facility]  PHQ-9 Total Score  5       Safety -- tobacco screening: not using -- alcohol screening:  low-risk usage. -- no evidence of domestic violence or intimate partner violence.   Cancer Screening -- pap smear done with OB/GYN will obtain per ASCCP guidelines -- family history of breast cancer screening: done. not at high risk. -- Mammogram - up to date, will get records  -- Colon cancer screening- up to date  Immunizations -- flu vaccine up to date -- TDAP q10 years up to date -- Covid vaccine - dose one complete  Encouraged healthy lifestyle and continue maintaining weight. Reviewed outside labs and normal lipids and glucose.   Lesleigh Noe, MD

## 2019-09-19 NOTE — Patient Instructions (Signed)
Preventive Care 51-51 Years Old, Female Preventive care refers to visits with your health care provider and lifestyle choices that can promote health and wellness. This includes:  A yearly physical exam. This may also be called an annual well check.  Regular dental visits and eye exams.  Immunizations.  Screening for certain conditions.  Healthy lifestyle choices, such as eating a healthy diet, getting regular exercise, not using drugs or products that contain nicotine and tobacco, and limiting alcohol use. What can I expect for my preventive care visit? Physical exam Your health care provider will check your:  Height and weight. This may be used to calculate body mass index (BMI), which tells if you are at a healthy weight.  Heart rate and blood pressure.  Skin for abnormal spots. Counseling Your health care provider may ask you questions about your:  Alcohol, tobacco, and drug use.  Emotional well-being.  Home and relationship well-being.  Sexual activity.  Eating habits.  Work and work environment.  Method of birth control.  Menstrual cycle.  Pregnancy history. What immunizations do I need?  Influenza (flu) vaccine  This is recommended every year. Tetanus, diphtheria, and pertussis (Tdap) vaccine  You may need a Td booster every 10 years. Varicella (chickenpox) vaccine  You may need this if you have not been vaccinated. Zoster (shingles) vaccine  You may need this after age 51. Measles, mumps, and rubella (MMR) vaccine  You may need at least one dose of MMR if you were born in 1957 or later. You may also need a second dose. Pneumococcal conjugate (PCV13) vaccine  You may need this if you have certain conditions and were not previously vaccinated. Pneumococcal polysaccharide (PPSV23) vaccine  You may need one or two doses if you smoke cigarettes or if you have certain conditions. Meningococcal conjugate (MenACWY) vaccine  You may need this if you  have certain conditions. Hepatitis A vaccine  You may need this if you have certain conditions or if you travel or work in places where you may be exposed to hepatitis A. Hepatitis B vaccine  You may need this if you have certain conditions or if you travel or work in places where you may be exposed to hepatitis B. Haemophilus influenzae type b (Hib) vaccine  You may need this if you have certain conditions. Human papillomavirus (HPV) vaccine  If recommended by your health care provider, you may need three doses over 6 months. You may receive vaccines as individual doses or as more than one vaccine together in one shot (combination vaccines). Talk with your health care provider about the risks and benefits of combination vaccines. What tests do I need? Blood tests  Lipid and cholesterol levels. These may be checked every 5 years, or more frequently if you are over 51 years old.  Hepatitis C test.  Hepatitis B test. Screening  Lung cancer screening. You may have this screening every year starting at age 51 if you have a 30-pack-year history of smoking and currently smoke or have quit within the past 15 years.  Colorectal cancer screening. All adults should have this screening starting at age 51 and continuing until age 75. Your health care provider may recommend screening at age 51 if you are at increased risk. You will have tests every 1-10 years, depending on your results and the type of screening test.  Diabetes screening. This is done by checking your blood sugar (glucose) after you have not eaten for a while (fasting). You may have this   done every 1-3 years.  Mammogram. This may be done every 1-2 years. Talk with your health care provider about when you should start having regular mammograms. This may depend on whether you have a family history of breast cancer.  BRCA-related cancer screening. This may be done if you have a family history of breast, ovarian, tubal, or peritoneal  cancers.  Pelvic exam and Pap test. This may be done every 3 years starting at age 51. Starting at age 3, this may be done every 5 years if you have a Pap test in combination with an HPV test. Other tests  Sexually transmitted disease (STD) testing.  Bone density scan. This is done to screen for osteoporosis. You may have this scan if you are at high risk for osteoporosis. Follow these instructions at home: Eating and drinking  Eat a diet that includes fresh fruits and vegetables, whole grains, lean protein, and low-fat dairy.  Take vitamin and mineral supplements as recommended by your health care provider.  Do not drink alcohol if: ? Your health care provider tells you not to drink. ? You are pregnant, may be pregnant, or are planning to become pregnant.  If you drink alcohol: ? Limit how much you have to 0-1 drink a day. ? Be aware of how much alcohol is in your drink. In the [place] one drink equals one 12 oz bottle of beer (355 mL), one 5 oz glass of wine (148 mL), or one 1 oz glass of hard liquor (44 mL). Lifestyle  Take daily care of your teeth and gums.  Stay active. Exercise for at least 30 minutes on 5 or more days each week.  Do not use any products that contain nicotine or tobacco, such as cigarettes, e-cigarettes, and chewing tobacco. If you need help quitting, ask your health care provider.  If you are sexually active, practice safe sex. Use a condom or other form of birth control (contraception) in order to prevent pregnancy and STIs (sexually transmitted infections).  If told by your health care provider, take low-dose aspirin daily starting at age 51. What's next?  Visit your health care provider once a year for a well check visit.  Ask your health care provider how often you should have your eyes and teeth checked.  Stay up to date on all vaccines. This information is not intended to replace advice given to you by your health care provider. Make sure you  discuss any questions you have with your health care provider. Document Revised: 04/29/2018 Document Reviewed: 04/29/2018 Elsevier Patient Education  2020 [employer].

## 2019-09-21 ENCOUNTER — Encounter: Payer: Self-pay | Admitting: Obstetrics

## 2020-02-17 ENCOUNTER — Ambulatory Visit (INDEPENDENT_AMBULATORY_CARE_PROVIDER_SITE_OTHER): Payer: Self-pay | Admitting: Plastic Surgery

## 2020-02-17 ENCOUNTER — Encounter: Payer: Self-pay | Admitting: Plastic Surgery

## 2020-02-17 ENCOUNTER — Other Ambulatory Visit: Payer: Self-pay

## 2020-02-17 VITALS — BP 113/82 | HR 92 | Temp 97.5°F | Ht 60.0 in | Wt 116.2 lb

## 2020-02-17 DIAGNOSIS — Z411 Encounter for cosmetic surgery: Secondary | ICD-10-CM

## 2020-02-17 NOTE — Progress Notes (Signed)
Referring Provider Lesleigh Noe, MD [place],  [place] [address]   CC:  Chief Complaint  Patient presents with  . Advice Only      Cindy Gross is an 49 y.o. female.  HPI: Patient presents to discuss breast augmentation.  After having children she is dissatisfied with the appearance of her breast and lack of fullness in the upper pole. She feels like they have descended a bit. 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 She also works in healthcare in [employer] and would like to get this done before going back to work in mid August. She has had screening mammograms and at one time had closer imaging and ultrasounds done but findings were not suspicious enough to prompt a needle biopsy.  No Known Allergies  Outpatient Encounter Medications as of 02/17/2020  Medication Sig  . levalbuterol (XOPENEX HFA) 45 MCG/ACT inhaler Inhale 1-2 puffs into the lungs every 4 (four) hours as needed for wheezing or shortness of breath.  . levonorgestrel (MIRENA, 52 MG,) 20 MCG/24HR IUD Mirena 20 mcg/24 hours (5 yrs) 52 mg intrauterine device  Take 1 device by intrauterine route.  Marland Kitchen UNABLE TO FIND Med Name: MEDICATION FOR STUDY DUE TO ECZEMA: NAME IS ABROCITINIB-EITHER 100 MG OR 200 MG-NOT SURE   No facility-administered encounter medications on file as of 02/17/2020.     Past Medical History:  Diagnosis Date  . Allergy   . Asthma    mild exercise induced  . Eczema   . IUD (intrauterine device) in place   . NSVD (normal spontaneous vaginal delivery)     Past Surgical History:  Procedure Laterality Date  . CESAREAN SECTION      Family History  Problem Relation Age of Onset  . Hypertension Mother        lost weight an no longer needs medicine  . Hypothyroidism Mother   . Heart disease Father        cabg  . Hyperlipidemia Father   . Hypertension Father   . Breast cancer  Paternal Grandmother   . Heart failure Paternal Grandmother   . CAD Paternal Grandfather   . Heart attack Paternal Grandfather 81  . Leukemia Paternal Grandfather   . Colon cancer Neg Hx   . Esophageal cancer Neg Hx   . Rectal cancer Neg Hx   . 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 Overall volume currently is probably around a B cup. She does have a lack of fullness in the upper pole. The breast shape, volume, and areolar complexes are quite symmetric. I do not see any obvious scars or masses. Her base width is 10 cm. Pinch test in the upper pole is about a centimeter.  Assessment/Plan Patient presents to discuss breast augmentation. She is an excellent candidate. We discussed dual plane placement through an inframammary approach. She feels more comfortable with saline implants which I think is fine for her. We discussed the risks include bleeding, infection, damage to surrounding structures, need for additional procedures in the future. We discussed the potential for asymmetries and implant complications. We  discussed the location of the scars. We discussed the potential need for mastopexy and I would determine that at the time of surgery. If anything she may only require a crescent excision to raise the nipple a few centimeters. I do not think a vertical component to the mastopexy would be worth it for her after an implant is placed, but it Cindy Gross be easier to determine that with the sizer in place. After experimenting with the trial implants in the office she seems to prefer somewhere between 200-250cc in volume. We did discuss the details of the procedure, postoperative expectations, and anticipated downtime. We Cindy Gross plan to provide a quote for her and all of her questions were answered today.  Cindra Presume 02/17/2020, 4:32 PM

## 2020-03-01 ENCOUNTER — Institutional Professional Consult (permissible substitution): Payer: Managed Care, Other (non HMO) | Admitting: Plastic Surgery

## 2020-03-01 HISTORY — PX: AUGMENTATION MAMMAPLASTY: SUR837

## 2020-03-07 DIAGNOSIS — Z719 Counseling, unspecified: Secondary | ICD-10-CM

## 2020-03-13 ENCOUNTER — Encounter: Payer: Self-pay | Admitting: Surgical

## 2020-03-13 NOTE — Progress Notes (Signed)
Patient ID: Cindy Gross, female    DOB: 01/07/1971, 49 y.o.   MRN: [id number]  Chief Complaint  Patient presents with  . Pre-op Exam      ICD-10-CM   1. Encounter for cosmetic procedure  Z41.1      History of Present Illness: Cindy Gross is a 49 y.o.  female her for pre-op in regards to breast augmentation. She presents for preoperative evaluation for upcoming procedure, bilateral breast augmentation with crescent mastopexy, scheduled for 03/26/20 with Dr. Claudia Desanctis at [facility]  No history of DVT/PE.  No family history of DVT/PE.  No family or personal history of bleeding or clotting disorders.  Patient is not currently taking any blood thinners.  No history of CVA/MI.   Patient reports she has never had any surgical intervention other than a C-section. She reports no family history of any anesthetic complications.   Summary of Previous Visit: Patient was here to discuss breast augmentation, patient is dissatisfied with appearance of her breasts and lack of fullness in the upper pole.  She feels as if they have descended a bit.  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  Patient had screening mammograms at one time had closer emerging and ultrasound performed but findings were not suspicious enough to prompt needle biopsy.  After experimenting with trial implants in the office, patient seems to refer somewhere between 200 250 cc in volume.  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 Patient currently has Mirena in place.   Past Medical History: Allergies: No Known Allergies  Current Medications:  Current Outpatient Medications:  .  levalbuterol (XOPENEX HFA) 45 MCG/ACT inhaler, Inhale 1-2 puffs into the lungs every 4 (four) hours as needed for wheezing or shortness of breath., Disp: 1 Inhaler, Rfl: 12 .  levonorgestrel  (MIRENA, 52 MG,) 20 MCG/24HR IUD, Mirena 20 mcg/24 hours (5 yrs) 52 mg intrauterine device  Take 1 device by intrauterine route., Disp: , Rfl:  .  UNABLE TO FIND, Med Name: MEDICATION FOR STUDY DUE TO ECZEMA: NAME IS ABROCITINIB-EITHER 100 MG OR 200 MG-NOT SURE, Disp: , Rfl:   Past Medical Problems: Past Medical History:  Diagnosis Date  . Allergy   . Asthma    mild exercise induced  . Eczema   . IUD (intrauterine device) in place   . NSVD (normal spontaneous vaginal delivery)     Past Surgical History: Past Surgical History:  Procedure Laterality Date  . CESAREAN SECTION      Social History: Social History   Socioeconomic History  . Marital status: Legally Separated    Spouse name: Cindy Gross  . Number of children: 2  . Years of education: Masters Degree  . Highest education level: Not on file  Occupational History  . Not on file  Tobacco Use  . Smoking status: Never Smoker  . Smokeless tobacco: Never Used  Substance and Sexual Activity  . Alcohol use: Yes    Comment: 1-2 drinks a week  . Drug use: No  . Sexual activity: Yes    Birth control/protection: I.U.D.  Other Topics Concern  . 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 Difficulty of Paying Living Expenses:   Food Insecurity:   . Worried About Charity fundraiser in the Last Year:   . Arboriculturist in the Last Year:   Transportation Needs:   . Film/video editor (Medical):   Marland Kitchen Lack of Transportation (Non-Medical):   Physical  Activity:   . Days of Exercise per Week:   . Minutes of Exercise per Session:   Stress:   . Feeling of Stress :   Social Connections:   . Frequency of Communication with Friends and Family:   . Frequency of Social Gatherings with Friends and Family:   . Attends Religious Services:   . Active Member of Clubs or Organizations:   . Attends Archivist Meetings:   Marland Kitchen Marital Status:   Intimate Partner Violence:   . Fear of Current or Ex-Partner:   . Emotionally Abused:   Marland Kitchen Physically Abused:   . Sexually Abused:     Family History: Family History  Problem Relation Age of Onset  . Hypertension Mother        lost weight an no longer needs medicine  . Hypothyroidism Mother   . Heart disease Father        cabg  . Hyperlipidemia Father   . Hypertension Father   . Breast cancer Paternal Grandmother   . Heart failure Paternal Grandmother   . CAD Paternal Grandfather   . Heart attack Paternal Grandfather 81  . Leukemia Paternal Grandfather   . Colon cancer Neg Hx   . Esophageal cancer Neg Hx   . Rectal cancer Neg Hx   . Stomach cancer Neg Hx     Review of Systems: Review of Systems  Constitutional: Negative.   Respiratory: Negative.   Cardiovascular: Negative.   Gastrointestinal: Negative.     Physical Exam: Vital Signs BP 102/65 (BP Location: Left Arm, Patient Position: Sitting, Cuff Size: Normal)   Pulse (!) 47   Temp 98.7 F (37.1 C) (Temporal)   Ht 5' (1.524 m)   Wt 114 lb 6.4 oz (51.9 kg)   SpO2 96%   BMI 22.34 kg/m   Physical Exam Constitutional:      General: She is not in acute distress.    Appearance: Normal appearance. She is normal weight. She is not ill-appearing.  HENT:     Head: Normocephalic and atraumatic.  Cardiovascular:     Rate and Rhythm: Normal rate and regular rhythm.     Pulses: Normal pulses.     Heart sounds: Normal heart sounds.  Pulmonary:     Effort: Pulmonary effort is normal.     Breath sounds: Normal breath sounds.    Abdominal:     General: There is no distension.  Musculoskeletal:        General: No swelling.     Right lower leg: No edema.     Left lower leg: No edema.  Skin:    General: Skin is warm and dry.  Neurological:     General: No focal deficit present.     Mental Status: She is alert and oriented to person, place, and time. Mental status is at  baseline.  Psychiatric:        Mood and Affect: Mood normal.        Behavior: Behavior normal.    Assessment/Plan: Ms. Nied scheduled for bilateral breast augmentation with possible crescent mastopexy with Dr. Claudia Desanctis.  Risks, benefits, and alternatives of procedure discussed, questions answered and consent obtained.    Discussed with patient current implant order and allowed her to try on the sizers in the office again today.  She reported that the 250 cc implant felt too large for her and a comfortable range for her was between 200-215/225.   Smoking Status: Non-smoker Last Mammogram: Patient had diagnostic breast mammogram on the left, no mammographic or sonographic evidence of malignancy in the left breast.  Caprini Score: High; Risk Factors include: Age, varicose veins of left leg, currently has Mirena IUD in place, and length of planned surgery. Recommendation for mechanical and pharmacological prophylaxis during surgery. Encourage early ambulation.   Pictures obtained: 02/17/2020  Post-op Rx sent to pharmacy: Zofran, Norco  Patient was provided with the General Surgical Risk consent document and Pain Medication Agreement prior to their appointment.  They had adequate time to read through the risk consent documents and Pain Medication Agreement. We also discussed them in person together during this preop appointment. All of their questions were answered to their satisfaction.  Recommended calling if they have any further questions.  Risk consent form and Pain Medication Agreement to be scanned into patient's chart.  The risk that can be  encountered with breast augmentation or maastopexy were discussed and include the following but not limited to these:  Breast asymmetry, fluid accumulation, firmness of the breast, inability to breast feed, loss of nipple or areola, skin loss, decrease or no nipple sensation, fat necrosis of the breast tissue, bleeding, infection, healing delay.  Deep vein thrombosis, cardiac and pulmonary complications are risks to any procedure. The implant can have a faulty position or one different from what you had desired.  The implant can have rippling, wrinkling, leakage or rupture. There are risks of anesthesia, changes to skin sensation and injury to nerves or blood vessels.  The muscle can be temporarily or permanently injured.  You may have an allergic reaction to tape, suture, glue, blood products which can result in skin discoloration, swelling, pain, skin lesions, poor healing.  Any of these can lead to the need for revisonal surgery or stage procedures.   This procedure is best done when the breast is fully developed.  Changes in the breast will continue to occur over time.  Pregnancy can alter the outcomes of previous breast reduction surgery, weight gain and weigh loss can also effect the long term appearance. Implants are not guaranteed to last a lifetime.  Future surgery may be required.  Regular examinations of the breast are required to evaluate the condition of your breasts and implants.    Electronically signed by: Carola Rhine Cagney Degrace, PA-C 03/14/2020 8:44 AM

## 2020-03-14 ENCOUNTER — Encounter: Payer: Self-pay | Admitting: Surgical

## 2020-03-14 ENCOUNTER — Other Ambulatory Visit: Payer: Self-pay

## 2020-03-14 ENCOUNTER — Ambulatory Visit (INDEPENDENT_AMBULATORY_CARE_PROVIDER_SITE_OTHER): Payer: Self-pay | Admitting: Surgical

## 2020-03-14 VITALS — BP 102/65 | HR 47 | Temp 98.7°F | Ht 60.0 in | Wt 114.4 lb

## 2020-03-14 DIAGNOSIS — Z411 Encounter for cosmetic surgery: Secondary | ICD-10-CM

## 2020-03-14 MED ORDER — HYDROCODONE-ACETAMINOPHEN 5-325 MG PO TABS: 1.0000 | ORAL_TABLET | Freq: Four times a day (QID) | ORAL | 0 refills | Status: AC | PRN

## 2020-03-14 MED ORDER — ONDANSETRON HCL 4 MG PO TABS: 4.0000 mg | ORAL_TABLET | Freq: Three times a day (TID) | ORAL | 0 refills | Status: DC | PRN

## 2020-03-19 ENCOUNTER — Encounter: Payer: Self-pay | Admitting: Plastic Surgery

## 2020-03-21 ENCOUNTER — Encounter: Payer: Self-pay | Admitting: Plastic Surgery

## 2020-03-28 ENCOUNTER — Encounter: Payer: Self-pay | Admitting: Plastic Surgery

## 2020-04-04 ENCOUNTER — Other Ambulatory Visit: Payer: Self-pay

## 2020-04-04 ENCOUNTER — Encounter: Payer: Self-pay | Admitting: Plastic Surgery

## 2020-04-04 ENCOUNTER — Ambulatory Visit (INDEPENDENT_AMBULATORY_CARE_PROVIDER_SITE_OTHER): Payer: Self-pay | Admitting: Plastic Surgery

## 2020-04-04 VITALS — BP 118/81 | HR 75 | Temp 98.7°F

## 2020-04-04 DIAGNOSIS — Z411 Encounter for cosmetic surgery: Secondary | ICD-10-CM

## 2020-04-04 NOTE — Progress Notes (Signed)
Patient is postop from bilateral breast augmentation.  This was about a week ago.  She feels like she is doing well with no complaints.  She has a little bit of soreness when turning over in bed but otherwise has been doing fine has not taken any pain medication.  On exam everything looks great with a nice contour and good symmetry.  Her inframammary incisions are healing fine with no signs of erythema or other problems.  We discussed postoperative management from this point forward and recommended wearing a supportive bra as much as possible and avoiding strenuous activity.  We will plan to have her come back in 3 to 5 weeks or so for another appointment.  All of her questions were answered.  She is overall very happy with her result thus far.

## 2020-04-19 ENCOUNTER — Encounter: Payer: Self-pay | Admitting: Family Medicine

## 2020-04-25 NOTE — Telephone Encounter (Signed)
Received form through mail. Placed in Rx tower.

## 2020-04-26 ENCOUNTER — Telehealth: Payer: Self-pay

## 2020-04-26 NOTE — Telephone Encounter (Signed)
Left pt a VM asking her to call me back with her waist circumference so I can finish her screening form for her job.

## 2020-05-02 NOTE — Telephone Encounter (Signed)
Form is all filled out except for waist circumference. Left pt a VM and a mychart message asking for that measurement.

## 2020-05-31 ENCOUNTER — Encounter: Payer: Self-pay | Admitting: Plastic Surgery

## 2020-05-31 ENCOUNTER — Ambulatory Visit (INDEPENDENT_AMBULATORY_CARE_PROVIDER_SITE_OTHER): Payer: Self-pay | Admitting: Plastic Surgery

## 2020-05-31 ENCOUNTER — Other Ambulatory Visit: Payer: Self-pay

## 2020-05-31 VITALS — BP 116/84 | HR 81 | Temp 98.3°F

## 2020-05-31 DIAGNOSIS — Z411 Encounter for cosmetic surgery: Secondary | ICD-10-CM

## 2020-05-31 NOTE — Progress Notes (Signed)
Patient is over 2 months out from bilateral breast augmentation.  She is overall very happy and thinks things are going well.  She started running again and is very busy at work.  On examination she has well-healed inframammary incisions.  The size shape and contour are quite good.  The left nipple areolar complex is slightly lower than the right side.  Overall the patient is very happy and does not desire any further corrections at this time.  I did bring up the option of doing a crescent mastopexy on the left side in the office but she does not see the need for and the asymmetry is fairly subtle.  Otherwise I think the outcome is great and so does she.  I will plan to see her again on an as-needed basis.

## 2020-07-26 IMAGING — MG MM DIGITAL DIAGNOSTIC UNILAT*L* W/ TOMO W/ CAD
4 series · 4 of 12 positions shown · non-contrast
Comparison: Previous exam(s).

CLINICAL DATA: 48-year-old female presenting as a recall from
screening for possible left breast asymmetry.

EXAM:
DIGITAL DIAGNOSTIC LEFT MAMMOGRAM WITH TOMO
ULTRASOUND LEFT BREAST

[L ML synth-2D]
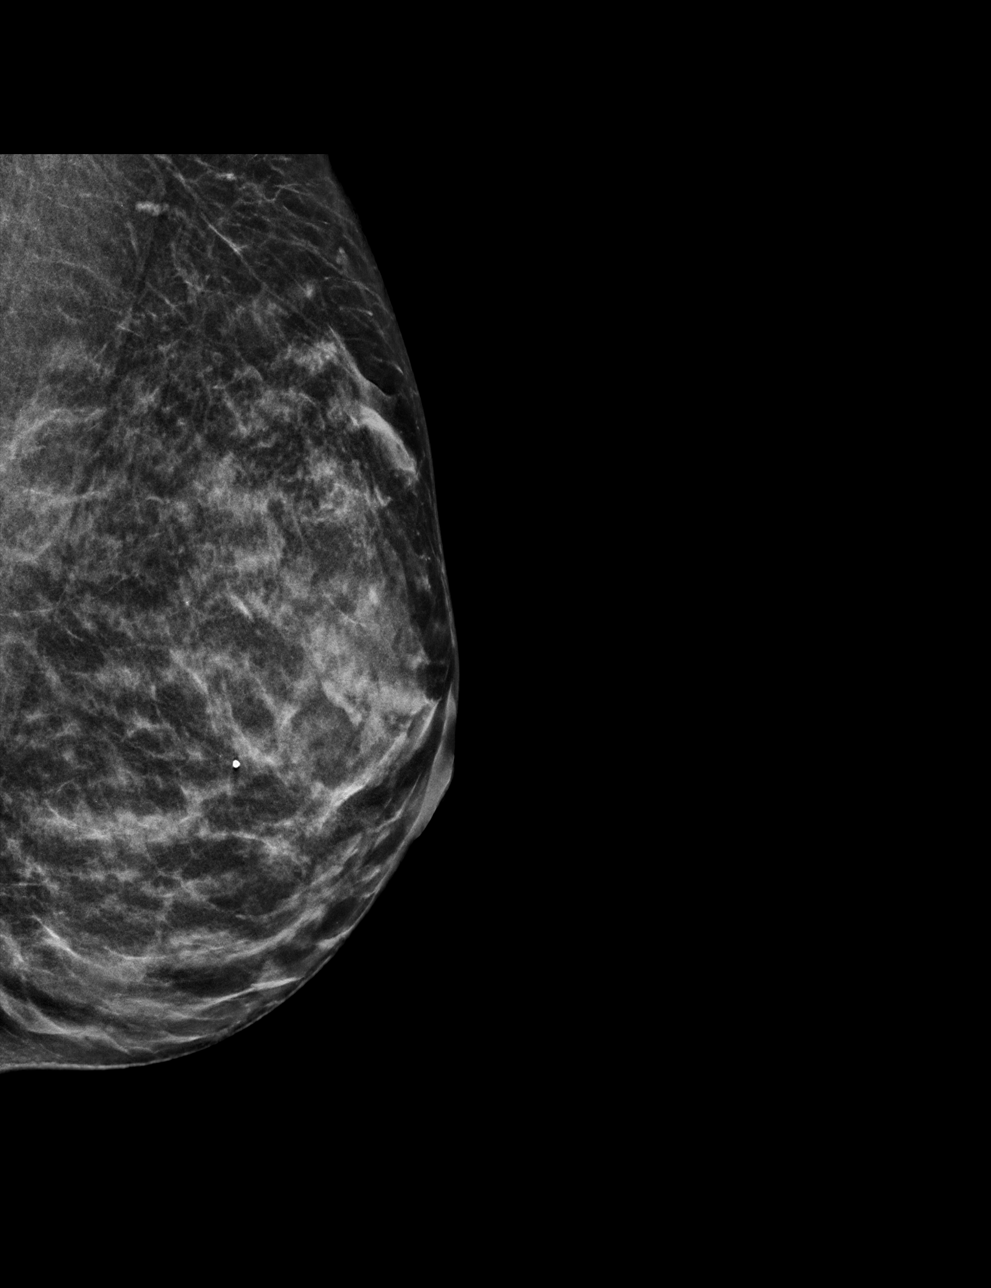

[L MLO synth-2D]
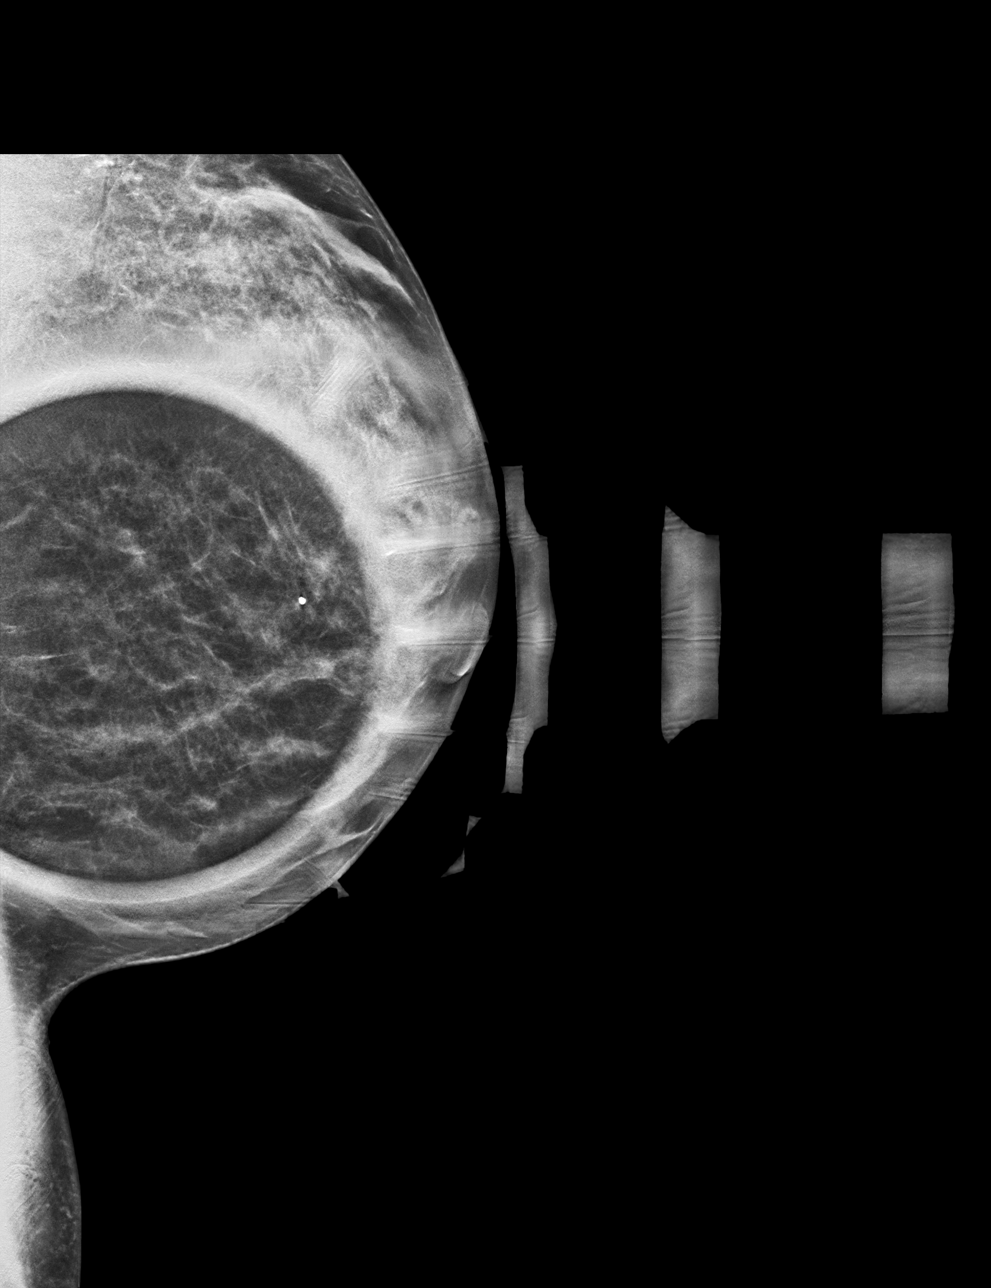

[L ML tomo · tomo slice 23/44.0]
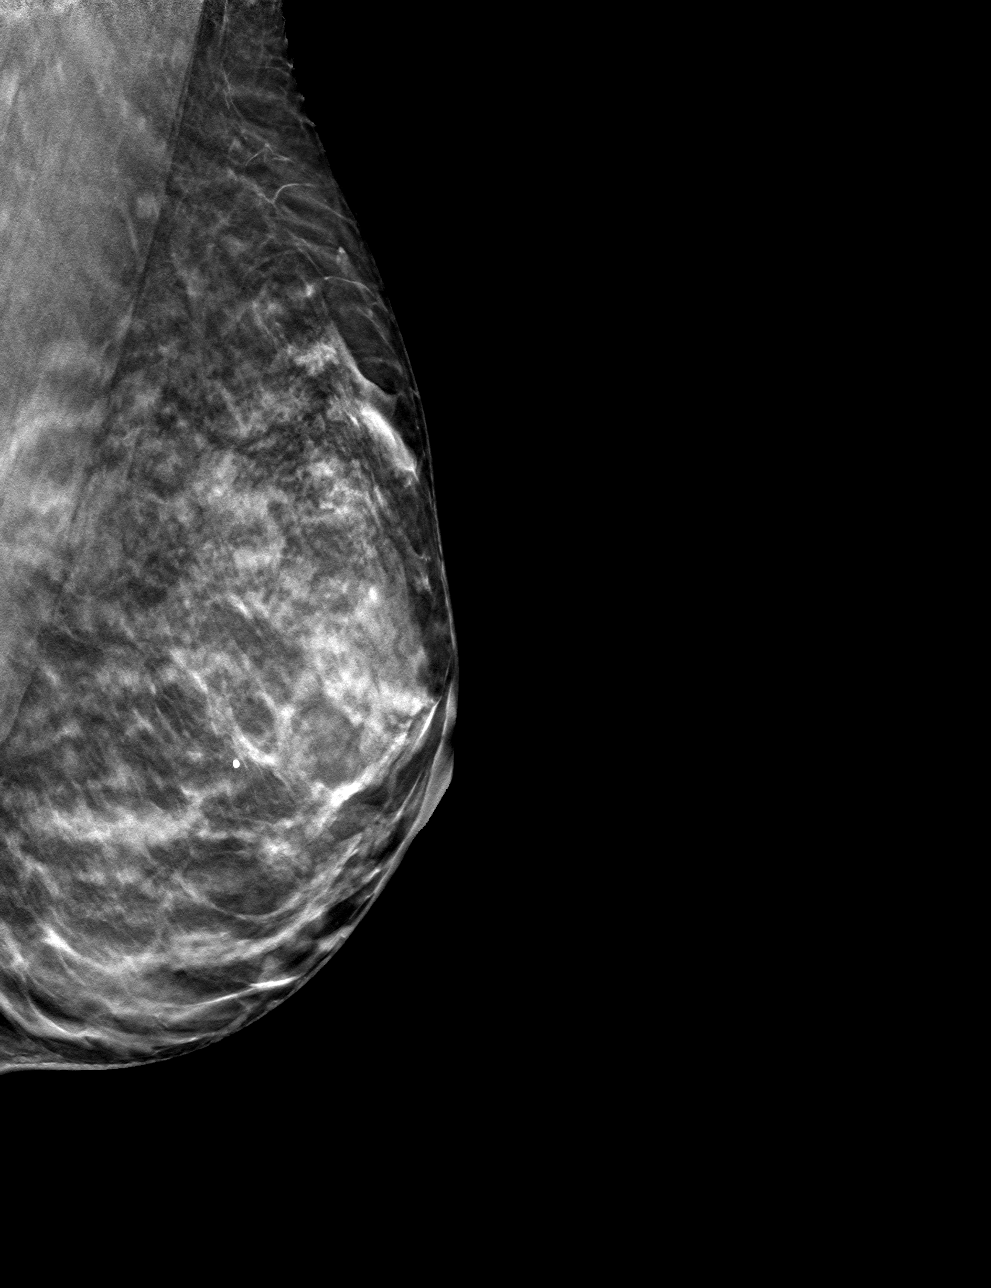

[L MLO tomo · tomo slice 21/42.0]
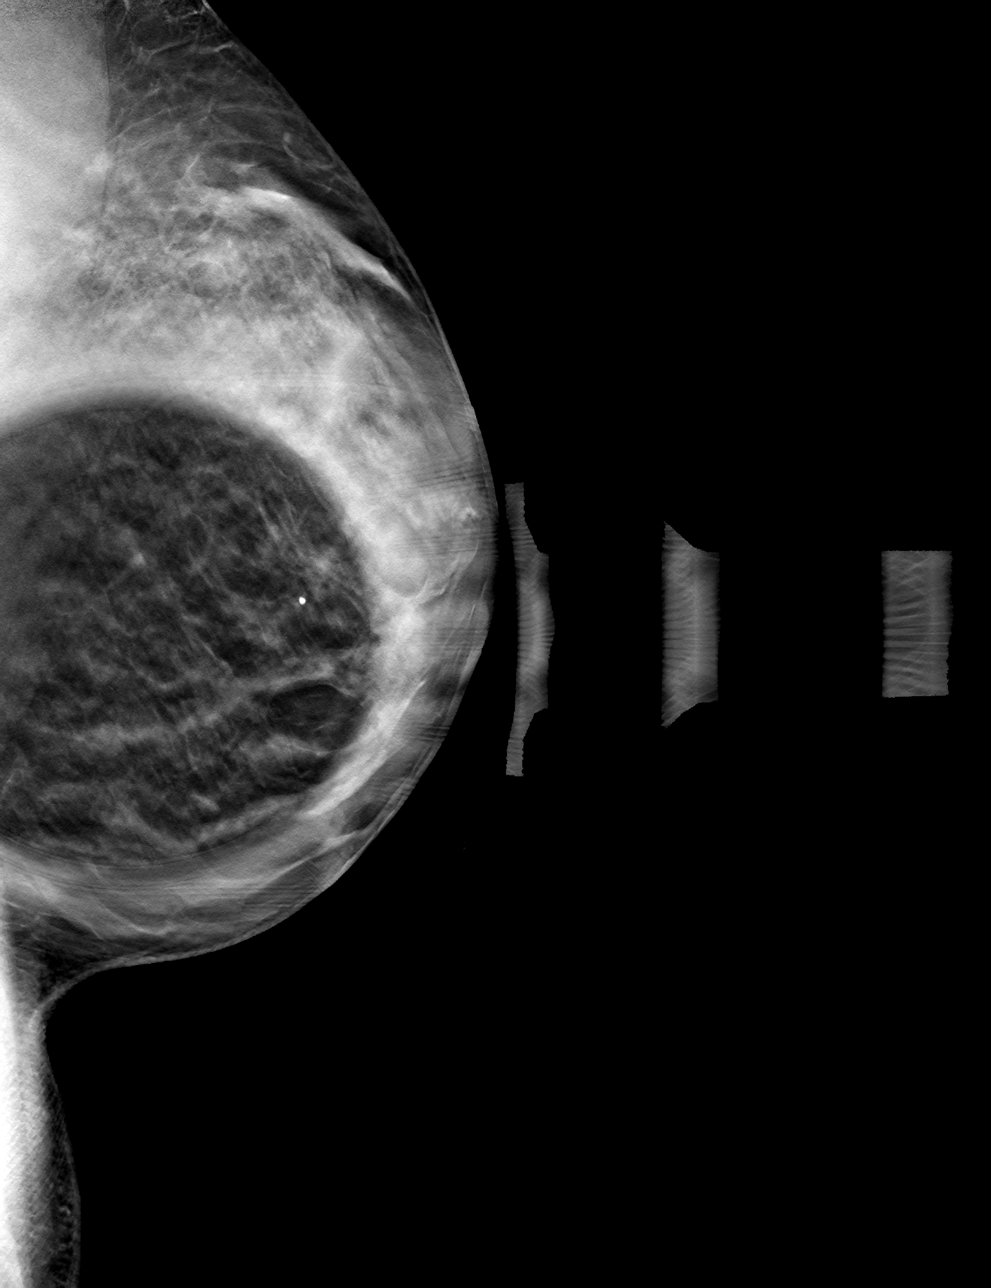

[4 of 12 positions shown; findings below may reference images not displayed]

ACR Breast Density Category c: The breast tissue is heterogeneously
dense, which may obscure small masses.
FINDINGS: Mammogram:

Additional full field mL and spot compression views were performed
for the questioned asymmetry in the lower left breast. On the
additional imaging the tissue in this area disperses without
underlying mass or distortion present.

Ultrasound:

Targeted ultrasound is performed throughout the lower inner left
breast demonstrating no discrete cystic or solid mass. No finding to
correspond to the questioned area on the mammogram.
IMPRESSION: No mammographic or sonographic evidence of malignancy in the left
breast.

RECOMMENDATION:
Screening mammogram in one year.(Code:[id number]-[id number])

I have discussed the findings and recommendations with the patient.
If applicable, a reminder letter will be sent to the patient
regarding the next appointment.

BI-RADS CATEGORY  1: Negative.

## 2020-09-19 ENCOUNTER — Encounter: Payer: Self-pay | Admitting: Family Medicine

## 2020-10-26 LAB — HM MAMMOGRAPHY

## 2020-10-26 LAB — HM PAP SMEAR: HM Pap smear: NEGATIVE

## 2020-10-26 LAB — RESULTS CONSOLE HPV: CHL HPV: NEGATIVE

## 2020-11-07 ENCOUNTER — Encounter: Payer: Self-pay | Admitting: Family Medicine

## 2020-11-26 ENCOUNTER — Encounter: Payer: Self-pay | Admitting: Family Medicine

## 2021-02-11 ENCOUNTER — Encounter: Payer: Self-pay | Admitting: Family Medicine

## 2021-02-12 ENCOUNTER — Other Ambulatory Visit: Payer: Self-pay

## 2021-02-12 ENCOUNTER — Ambulatory Visit (INDEPENDENT_AMBULATORY_CARE_PROVIDER_SITE_OTHER): Payer: Commercial Managed Care - PPO | Admitting: Family Medicine

## 2021-02-12 ENCOUNTER — Encounter: Payer: Self-pay | Admitting: Family Medicine

## 2021-02-12 VITALS — BP 84/62 | HR 78 | Temp 97.2°F | Ht 60.9 in | Wt 118.8 lb

## 2021-02-12 DIAGNOSIS — J45990 Exercise induced bronchospasm: Secondary | ICD-10-CM

## 2021-02-12 DIAGNOSIS — N390 Urinary tract infection, site not specified: Secondary | ICD-10-CM | POA: Diagnosis not present

## 2021-02-12 DIAGNOSIS — Z0000 Encounter for general adult medical examination without abnormal findings: Secondary | ICD-10-CM

## 2021-02-12 DIAGNOSIS — Z1159 Encounter for screening for other viral diseases: Secondary | ICD-10-CM

## 2021-02-12 LAB — LIPID PANEL
Cholesterol: 169 mg/dL (ref 0–200)
HDL: 74.1 mg/dL (ref >39.00)
LDL Cholesterol: 87 mg/dL (ref 0–99)
NonHDL: 94.83
Total CHOL/HDL Ratio: 2
Triglycerides: 37 mg/dL (ref 0.0–149.0)
VLDL: 7.4 mg/dL (ref 0.0–40.0)

## 2021-02-12 LAB — COMPREHENSIVE METABOLIC PANEL
ALT: 15 U/L (ref 0–35)
AST: 23 U/L (ref 0–37)
Albumin: 4.5 g/dL (ref 3.5–5.2)
Alkaline Phosphatase: 45 U/L (ref 39–117)
BUN: 13 mg/dL (ref 6–23)
CO2: 27 mEq/L (ref 19–32)
Calcium: 9.6 mg/dL (ref 8.4–10.5)
Chloride: 104 mEq/L (ref 96–112)
Creatinine, Ser: 0.84 mg/dL (ref 0.40–1.20)
GFR: 80.28 mL/min (ref >60.00)
Glucose, Bld: 84 mg/dL (ref 70–99)
Potassium: 4.4 mEq/L (ref 3.5–5.1)
Sodium: 138 mEq/L (ref 135–145)
Total Bilirubin: 0.8 mg/dL (ref 0.2–1.2)
Total Protein: 7.4 g/dL (ref 6.0–8.3)

## 2021-02-12 MED ORDER — ALBUTEROL SULFATE 108 (90 BASE) MCG/ACT IN AEPB: 1.0000 | INHALATION_SPRAY | RESPIRATORY_TRACT | 1 refills | Status: DC | PRN

## 2021-02-12 NOTE — Progress Notes (Signed)
Annual Exam   Chief Complaint:  Chief Complaint  Patient presents with   Annual Exam    No concerns     History of Present Illness:  [name] is a 50 y.o. No obstetric history on file. who LMP was No LMP recorded. (Menstrual status: IUD)., presents today for her annual examination.     Nutrition She does get adequate calcium and Vitamin D in her diet. Diet: only eats seafood, avoids chicken/pork/red meat, dairy and eggs Exercise: triathlete - season is April - Oct  Safety The patient wears seatbelts: yes.     The patient feels safe at home and in their relationships: yes.   Menstrual:  Symptoms of menopause: none IUD - no periods, does have some spotting   GYN She is single partner, contraception - IUD.    Cervical Cancer Screening (21-65):   Last Pap:   February 2022 Results were: no abnormalities /neg HPV DNA   Breast Cancer Screening (Age 69-74):  There is FH of breast cancer. There is no FH of ovarian cancer. BRCA screening Not Indicated.  Last Mammogram: 10/26/2020 The patient does want a mammogram this year.    Colon Cancer Screening:  Age 15-75 yo - benefits outweigh the risk. Adults 75-85 yo who have never been screened benefit.  Benefits: 134000 people in 2016 will be diagnosed and 49,000 will die - early detection helps [place]: Complications 2/2 to colonoscopy High Risk (Colonoscopy): genetic disorder (Lynch syndrome or familial adenomatous polyposis), personal hx of IBD, previous adenomatous polyp, or previous colorectal cancer, FamHx start 10 years before the age at diagnosis, increased in males and black race  Options:  FIT - looks for hemoglobin (blood in the stool) - specific and fairly sensitive - must be done annually Cologuard - looks for DNA and blood - more sensitive - therefore can have more false positives, every 3 years Colonoscopy - every 10 years if normal - sedation, bowl prep, must have someone drive you  Shared decision  making and the patient had decided to do colonoscopy due in 2023 (3 year f/u).   Social History   Tobacco Use  Smoking Status Never  Smokeless Tobacco Never    Lung Cancer Screening (Ages 17-79): not applicable   Weight Wt Readings from Last 3 Encounters:  02/12/21 118 lb 12 oz (53.9 kg)  03/14/20 114 lb 6.4 oz (51.9 kg)  02/17/20 116 lb 3.2 oz (52.7 kg)   Patient has normal BMI  BMI Readings from Last 1 Encounters:  02/12/21 22.51 kg/m     Chronic disease screening Blood pressure monitoring:  BP Readings from Last 3 Encounters:  02/12/21 (!) 84/62  05/31/20 116/84  04/04/20 118/81    Lipid Monitoring: Indication for screening: age >21, obesity, diabetes, family hx, CV risk factors.  Lipid screening: Yes  Lab Results  Component Value Date   CHOL 195 08/11/2019   HDL 108 (A) 08/11/2019   LDLCALC 77 08/11/2019   TRIG 48 08/11/2019   CHOLHDL 2 01/01/2016     Diabetes Screening: age >50, overweight, family hx, PCOS, hx of gestational diabetes, at risk ethnicity Diabetes Screening screening: Yes  No results found for: HGBA1C   Past Medical History:  Diagnosis Date   Allergy    Asthma    mild exercise induced   Eczema    IUD (intrauterine device) in place    NSVD (normal spontaneous vaginal delivery)     Past Surgical History:  Procedure Laterality Date  CESAREAN SECTION      Prior to Admission medications   Medication Sig Start Date End Date Taking? Authorizing Provider  levalbuterol [facility] HFA) 45 MCG/ACT inhaler Inhale 1-2 puffs into the lungs every 4 (four) hours as needed for wheezing or shortness of breath. 06/25/16   Tonia Ghent, MD  levonorgestrel (MIRENA, 52 MG,) 20 MCG/24HR IUD Mirena 20 mcg/24 hours (5 yrs) 52 mg intrauterine device  Take 1 device by intrauterine route.    [provider]  ondansetron (ZOFRAN) 4 MG tablet Take 1 tablet (4 mg total) by mouth every 8 (eight) hours as needed for nausea or vomiting. 03/14/20    Scheeler, Carola Rhine, PA-C  UNABLE TO FIND Med Name: MEDICATION FOR STUDY DUE TO ECZEMA: NAME IS ABROCITINIB-EITHER 100 MG OR 200 MG-NOT SURE    [provider]    No Known Allergies  Gynecologic History: No LMP recorded. (Menstrual status: IUD).  Obstetric History: No obstetric history on file.  Social History   Socioeconomic History   Marital status: Legally Separated    Spouse name: Gwyndolyn Saxon   Number of children: 2   Years of education: Masters Degree   Highest education level: Not on file  Occupational History   Not on file  Tobacco Use   Smoking status: Never   Smokeless tobacco: Never  Substance and Sexual Activity   Alcohol use: Yes    Comment: 1-2 drinks a week   Drug use: No   Sexual activity: Yes    Birth control/protection: I.U.D.  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 [place]   Enjoys: exercise, yoga, fishing, [place]   Social support: friends, therapist   Social Determinants of Radio broadcast assistant Strain: Not on [employer] Insecurity: Not on file  Transportation Needs: Not on file  Physical Activity: Not on file  Stress: Not on file  Social Connections: Not on file  Intimate Partner Violence: Not on file    Family History  Problem Relation Age of Onset   Hypertension Mother        lost weight an no longer needs medicine   Hypothyroidism Mother    Heart disease Father        cabg   Hyperlipidemia Father    Hypertension Father    Breast cancer Paternal Grandmother    Heart failure Paternal Grandmother    CAD Paternal Grandfather    Heart attack Paternal Grandfather 81   Leukemia Paternal  Grandfather    Colon cancer Neg Hx    Esophageal cancer Neg Hx    Rectal cancer Neg Hx    Stomach cancer Neg Hx     Review of Systems  Constitutional:  Negative for chills and fever.  HENT:  Negative for congestion and sore throat.   Eyes:  Negative for blurred vision and double vision.  Respiratory:  Negative for shortness of breath.   Cardiovascular:  Negative for chest pain.  Gastrointestinal:  Negative for heartburn, nausea and vomiting.  Genitourinary: Negative.   Musculoskeletal: Negative.  Negative for myalgias.  Skin:  Negative for rash.  Neurological:  Negative for dizziness and headaches.  Endo/Heme/Allergies:  Does not bruise/bleed easily.  Psychiatric/Behavioral:  Negative for depression. The patient is not nervous/anxious.     Physical Exam BP (!) 84/62   Pulse 78   Temp (!) 97.2 F (36.2 C) (Temporal)   Ht 5' 0.9" (1.547 m)   Wt 118 lb 12 oz (53.9 kg)   SpO2 98%   BMI 22.51 kg/m    BP Readings from Last 3 Encounters:  02/12/21 (!) 84/62  05/31/20 116/84  04/04/20 118/81      Physical Exam Constitutional:      General: She is not in acute distress.    Appearance: She is well-developed. She is not diaphoretic.  HENT:     Head: Normocephalic and atraumatic.     Right Ear: External ear normal.     Left Ear: External ear normal.     Nose: Nose normal.  Eyes:     General: No scleral icterus.    Conjunctiva/sclera: Conjunctivae normal.  Cardiovascular:     Rate and Rhythm: Normal rate and regular rhythm.     Heart sounds: No murmur heard. Pulmonary:     Effort: Pulmonary effort is normal. No respiratory distress.     Breath sounds: Normal breath sounds. No wheezing.  Abdominal:     General: Bowel sounds are normal. There is no distension.     Palpations: Abdomen is soft. There is no mass.     Tenderness: There is no abdominal tenderness. There is no guarding or rebound.  Musculoskeletal:        General: Normal range of motion.     Cervical back:  Neck supple.  Lymphadenopathy:     Cervical: No cervical adenopathy.  Skin:    General: Skin is warm and dry.     Capillary Refill: Capillary refill takes less than 2 seconds.  Neurological:     Mental Status: She is alert and oriented to person, place, and time.     Deep Tendon Reflexes: Reflexes normal.  Psychiatric:        Behavior: Behavior normal.    Results:  PHQ-9:  [employer] Office Visit from 09/08/2018 in [place] at [place]  PHQ-9 Total Score 5         Assessment: 50 y.o. No obstetric history on file. female here for routine annual physical examination.  Plan: Problem List Items Addressed This Visit       Respiratory   Mild exercise-induced asthma - Primary   Relevant Medications   Albuterol Sulfate (PROAIR RESPICLICK) 440 (90 Base) MCG/ACT AEPB     Genitourinary   Postcoital UTI   Relevant Medications   nitrofurantoin (MACRODANTIN) 50 MG capsule    Screening: -- Blood pressure screen normal -- cholesterol screening: will obtain -- Weight screening: normal -- Diabetes Screening: will obtain -- Nutrition: Encouraged healthy diet  The ASCVD Risk score Mikey Bussing DC Jr., et al., 2013) failed to calculate for the following reasons:   The valid systolic blood pressure range is 90 to 200 mmHg   The valid HDL cholesterol range is 20 to 100 mg/dL  -- Statin therapy for Age 105-75 with CVD risk >7.5%  Psych -- Depression screening (PHQ-9):  [place] Visit from 09/08/2018 in [place] at [facility]  PHQ-9 Total Score 5        Safety -- tobacco screening: not using -- alcohol screening:  low-risk usage. -- no evidence of domestic violence or intimate partner violence.   Cancer Screening --  pap smear not collected per ASCCP guidelines -- family history of breast cancer screening: done. not at high risk. -- Mammogram -  up to date -- Colon cancer (age 63+)--  up to date  Immunizations Immunization History   Administered Date(s) Administered   Influenza Inj Mdck Quad Pf 05/27/2019   Moderna Sars-Covid-2 Vaccination 09/16/2019, 10/14/2019   PFIZER Comirnaty(Gray Top)Covid-19 Tri-Sucrose Vaccine 12/11/2020   Tdap 09/01/2012   Typhoid Inactivated 04/05/2013    -- flu vaccine not in season -- TDAP q10 years up to date -- Shingles (age >86) pt will check with insurance and return  -- Covid-19 Vaccine up to date   Encouraged healthy diet and exercise. Encouraged regular vision and dental care.    Lesleigh Noe, MD

## 2021-02-12 NOTE — Patient Instructions (Addendum)
Check with insurance on the shingles vaccine and then schedule a nurse visit   Preventive Care 20-52 Years Old, Female Preventive care refers to lifestyle choices and visits with your health care provider that can promote health and wellness. This includes: A yearly physical exam. This is also called an annual wellness visit. Regular dental and eye exams. Immunizations. Screening for certain conditions. Healthy lifestyle choices, such as: Eating a healthy diet. Getting regular exercise. Not using drugs or products that contain nicotine and tobacco. Limiting alcohol use. What can I expect for my preventive care visit? Physical exam Your health care provider will check your: Height and weight. These may be used to calculate your BMI (body mass index). BMI is a measurement that tells if you are at a healthy weight. Heart rate and blood pressure. Body temperature. Skin for abnormal spots. Counseling Your health care provider may ask you questions about your: Past medical problems. Family's medical history. Alcohol, tobacco, and drug use. Emotional well-being. Home life and relationship well-being. Sexual activity. Diet, exercise, and sleep habits. Work and work Statistician. Access to firearms. Method of birth control. Menstrual cycle. Pregnancy history. What immunizations do I need?  Vaccines are usually given at various ages, according to a schedule. Your health care provider will recommend vaccines for you based on your age, medicalhistory, and lifestyle or other factors, such as travel or where you work. What tests do I need? Blood tests Lipid and cholesterol levels. These may be checked every 5 years, or more often if you are over 74 years old. Hepatitis C test. Hepatitis B test. Screening Lung cancer screening. You may have this screening every year starting at age 82 if you have a 30-pack-year history of smoking and currently smoke or have quit within the past 15  years. Colorectal cancer screening. All adults should have this screening starting at age 67 and continuing until age 79. Your health care provider may recommend screening at age 76 if you are at increased risk. You will have tests every 1-10 years, depending on your results and the type of screening test. Diabetes screening. This is done by checking your blood sugar (glucose) after you have not eaten for a while (fasting). You may have this done every 1-3 years. Mammogram. This may be done every 1-2 years. Talk with your health care provider about when you should start having regular mammograms. This may depend on whether you have a family history of breast cancer. BRCA-related cancer screening. This may be done if you have a family history of breast, ovarian, tubal, or peritoneal cancers. Pelvic exam and Pap test. This may be done every 3 years starting at age 63. Starting at age 27, this may be done every 5 years if you have a Pap test in combination with an HPV test. Other tests STD (sexually transmitted disease) testing, if you are at risk. Bone density scan. This is done to screen for osteoporosis. You may have this scan if you are at high risk for osteoporosis. Talk with your health care provider about your test results, treatment options,and if necessary, the need for more tests. Follow these instructions at home: Eating and drinking  Eat a diet that includes fresh fruits and vegetables, whole grains, lean protein, and low-fat dairy products. Take vitamin and mineral supplements as recommended by your health care provider. Do not drink alcohol if: Your health care provider tells you not to drink. You are pregnant, may be pregnant, or are planning to become pregnant. If  you drink alcohol: Limit how much you have to 0-1 drink a day. Be aware of how much alcohol is in your drink. In the [place] one drink equals one 12 oz bottle of beer (355 mL), one 5 oz glass of wine (148 mL), or one  1 oz glass of hard liquor (44 mL).  Lifestyle Take daily care of your teeth and gums. Brush your teeth every morning and night with fluoride toothpaste. Floss one time each day. Stay active. Exercise for at least 30 minutes 5 or more days each week. Do not use any products that contain nicotine or tobacco, such as cigarettes, e-cigarettes, and chewing tobacco. If you need help quitting, ask your health care provider. Do not use drugs. If you are sexually active, practice safe sex. Use a condom or other form of protection to prevent STIs (sexually transmitted infections). If you do not wish to become pregnant, use a form of birth control. If you plan to become pregnant, see your health care provider for a prepregnancy visit. If told by your health care provider, take low-dose aspirin daily starting at age 34. Find healthy ways to cope with stress, such as: Meditation, yoga, or listening to music. Journaling. Talking to a trusted person. Spending time with friends and family. Safety Always wear your seat belt while driving or riding in a vehicle. Do not drive: If you have been drinking alcohol. Do not ride with someone who has been drinking. When you are tired or distracted. While texting. Wear a helmet and other protective equipment during sports activities. If you have firearms in your house, make sure you follow all gun safety procedures. What's next? Visit your health care provider once a year for an annual wellness visit. Ask your health care provider how often you should have your eyes and teeth checked. Stay up to date on all vaccines. This information is not intended to replace advice given to you by your health care provider. Make sure you discuss any questions you have with your healthcare provider. Document Revised: 05/22/2020 Document Reviewed: 04/29/2018 Elsevier Patient Education  2022 [employer].

## 2021-02-13 LAB — HEPATITIS C ANTIBODY
Hepatitis C Ab: NONREACTIVE
SIGNAL TO CUT-OFF: 0.02 (ref <1.00)

## 2021-02-20 ENCOUNTER — Encounter: Payer: Self-pay | Admitting: Family Medicine

## 2021-02-20 MED ORDER — ALBUTEROL SULFATE HFA 108 (90 BASE) MCG/ACT IN AERS: INHALATION_SPRAY | RESPIRATORY_TRACT | 1 refills | Status: DC

## 2021-06-12 ENCOUNTER — Ambulatory Visit: Payer: Self-pay | Admitting: Plastic Surgery

## 2021-06-12 ENCOUNTER — Encounter: Payer: Self-pay | Admitting: Plastic Surgery

## 2021-06-12 VITALS — BP 137/87 | HR 84 | Ht 60.0 in | Wt 119.8 lb

## 2021-06-12 DIAGNOSIS — Z411 Encounter for cosmetic surgery: Secondary | ICD-10-CM

## 2021-06-12 NOTE — Progress Notes (Signed)
Referring Provider Lesleigh Noe, MD [place],  [place] [address]   CC:  Chief Complaint  Patient presents with   Advice Only      Cindy Gross is an 50 y.o. female.  HPI: Patient presents to discuss abdominoplasty.  She is bothered by excess abdominal skin.  She wants to see if anything can be done to improve the contour.  Previous abdominal surgeries include a C-section in 2004.  She does not smoke and is not a diabetic.  I did perform breast augmentation on her back in the summer 2021.  She is happy with her results from that.  She also wanted to discuss thigh liposuction.  No Known Allergies  Outpatient Encounter Medications as of 06/12/2021  Medication Sig   albuterol (PROAIR HFA) 108 (90 Base) MCG/ACT inhaler One puff as needed prior to exercise   levonorgestrel (MIRENA) 20 MCG/24HR IUD Mirena 20 mcg/24 hours (5 yrs) 52 mg intrauterine device  Take 1 device by intrauterine route.   nitrofurantoin (MACRODANTIN) 50 MG capsule Take 50 mg by mouth as needed.   No facility-administered encounter medications on file as of 06/12/2021.     Past Medical History:  Diagnosis Date   Allergy    Asthma    mild exercise induced   Eczema    IUD (intrauterine device) in place    NSVD (normal spontaneous vaginal delivery)     Past Surgical History:  Procedure Laterality Date   CESAREAN SECTION      Family History  Problem Relation Age of Onset   Hypertension Mother        lost weight an no longer needs medicine   Hypothyroidism Mother    Heart disease Father        cabg   Hyperlipidemia Father    Hypertension Father    Breast cancer Paternal Grandmother    Heart failure Paternal Grandmother    CAD Paternal Grandfather    Heart attack Paternal Grandfather 73   Leukemia Paternal Grandfather    Colon cancer Neg Hx    Esophageal cancer Neg Hx    Rectal cancer Neg Hx    Stomach cancer Neg Hx     Social History   Social History Narrative    Recently separated from husband Gwyndolyn Saxon   2 sons - Will and Wellsite geologist (Will in college)   Conservation officer, historic buildings, 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  No obvious hernias.  She has a low transverse C-section scar.  She has minimal excess fat and good muscle tone.  She does have some skin redundancy in the infra and supraumbilical areas. Thighs: She has a small amount of  excess adipose tissue in the inner thigh and outer lateral thigh areas.  Overall good skin tone.  Assessment/Plan Had a long discussion about the options.  Ultimately I think abdominoplasty is the only way to remove the skin redundancy.  I do not anticipate she would need a plication but would perform 1 if the rectus muscles are separated.  We went over the location and orientation of the scar.  We went over the anticipated recovery process.  We discussed risks include bleeding, infection, damage to surrounding structures need for additional procedures.  She is going to think about this and let us know how she wants to proceed.  We also briefly discussed liposuction to the thighs.  I explained that I do believe it would be safe and could improve her contour to some degree.  I am uncertain if the effects would be noticed and closed  as she already has a good shape overall and does not have much tissue to remove.  She is going to weigh whether or not that would be worth it to her.  All of her questions were answered.  Cindra Presume 06/12/2021, 4:24 PM

## 2021-10-17 ENCOUNTER — Other Ambulatory Visit: Payer: Self-pay | Admitting: Obstetrics

## 2021-10-17 DIAGNOSIS — Z1231 Encounter for screening mammogram for malignant neoplasm of breast: Secondary | ICD-10-CM

## 2021-11-21 ENCOUNTER — Other Ambulatory Visit: Payer: Self-pay | Admitting: Obstetrics

## 2021-11-21 ENCOUNTER — Ambulatory Visit
Admission: RE | Admit: 2021-11-21 | Discharge: 2021-11-21 | Disposition: A | Discharge status: Home / Self Care | Payer: Commercial Managed Care - PPO | Source: Ambulatory Visit | Attending: Obstetrics | Admitting: Obstetrics

## 2021-11-21 DIAGNOSIS — Z1231 Encounter for screening mammogram for malignant neoplasm of breast: Secondary | ICD-10-CM

## 2021-11-27 ENCOUNTER — Encounter: Payer: Self-pay | Admitting: Family Medicine

## 2022-01-09 ENCOUNTER — Encounter: Payer: Self-pay | Admitting: Family Medicine

## 2022-02-26 ENCOUNTER — Encounter: Payer: Self-pay | Admitting: Family Medicine

## 2022-02-26 ENCOUNTER — Ambulatory Visit (INDEPENDENT_AMBULATORY_CARE_PROVIDER_SITE_OTHER): Payer: Commercial Managed Care - PPO | Admitting: Family Medicine

## 2022-02-26 VITALS — BP 118/80 | HR 71 | Temp 97.5°F | Ht 61.0 in | Wt 117.0 lb

## 2022-02-26 DIAGNOSIS — Z0000 Encounter for general adult medical examination without abnormal findings: Secondary | ICD-10-CM | POA: Diagnosis not present

## 2022-02-26 DIAGNOSIS — Z13220 Encounter for screening for lipoid disorders: Secondary | ICD-10-CM | POA: Diagnosis not present

## 2022-02-26 DIAGNOSIS — Z131 Encounter for screening for diabetes mellitus: Secondary | ICD-10-CM

## 2022-02-26 DIAGNOSIS — J45990 Exercise induced bronchospasm: Secondary | ICD-10-CM | POA: Diagnosis not present

## 2022-02-26 LAB — GLUCOSE, RANDOM: Glucose, Bld: 104 mg/dL — ABNORMAL HIGH (ref 70–99)

## 2022-02-26 LAB — LIPID PANEL
Cholesterol: 177 mg/dL (ref 0–200)
HDL: 82.3 mg/dL (ref >39.00)
LDL Cholesterol: 83 mg/dL (ref 0–99)
NonHDL: 94.61
Total CHOL/HDL Ratio: 2
Triglycerides: 56 mg/dL (ref 0.0–149.0)
VLDL: 11.2 mg/dL (ref 0.0–40.0)

## 2022-02-26 MED ORDER — ALBUTEROL SULFATE HFA 108 (90 BASE) MCG/ACT IN AERS: INHALATION_SPRAY | RESPIRATORY_TRACT | 1 refills | Status: DC

## 2022-02-26 NOTE — Patient Instructions (Signed)
Keep up the healthy diet and exercise  Shingles vaccine at your pharmacy - you can update via mychart or bring document to the next visit

## 2022-02-26 NOTE — Progress Notes (Signed)
Annual Exam   Chief Complaint:  Chief Complaint  Patient presents with   Annual Exam    No concerns    History of Present Illness:  Cindy Gross is a 51 y.o. No obstetric history on file. who LMP was No LMP recorded. (Menstrual status: IUD)., presents today for her annual examination.     Nutrition She does get adequate calcium and Vitamin D in her diet. Diet: healthy diet Exercise: triathlete - training for August    Social History   Tobacco Use  Smoking Status Never  Smokeless Tobacco Never   Social History   Substance and Sexual Activity  Alcohol Use Yes   Comment: 1-2 drinks a week   Social History   Substance and Sexual Activity  Drug Use No     General Health Dentist in the last year: Yes Eye doctor: goes every 3 years   Safety The patient wears seatbelts: yes.     The patient feels safe at home and in their relationships: yes.   Menstrual:  Symptoms of menopause: hot flashes for years - but rare  GYN She is single partner, contraception - IUD.    Cervical Cancer Screening (21-65):   Last Pap:   February 2022 Results were: no abnormalities /neg HPV DNA  Breast Cancer Screening (Age 33-74):  There is no FH of breast cancer. There is no FH of ovarian cancer. BRCA screening Not Indicated.  Last Mammogram: 10/2021 The patient does want a mammogram this year.    Colon Cancer Screening:  Age 76-75 yo - benefits outweigh the risk. Adults 63-85 yo who have never been screened benefit.  Benefits: 134000 people in 2016 will be diagnosed and 49,000 will die - early detection helps [place]: Complications 2/2 to colonoscopy High Risk (Colonoscopy): genetic disorder (Lynch syndrome or familial adenomatous polyposis), personal hx of IBD, previous adenomatous polyp, or previous colorectal cancer, FamHx start 10 years before the age at diagnosis, increased in males and black race  Options:  FIT - looks for hemoglobin (blood in the stool) -  specific and fairly sensitive - must be done annually Cologuard - looks for DNA and blood - more sensitive - therefore can have more false positives, every 3 years Colonoscopy - every 10 years if normal - sedation, bowl prep, must have someone drive you  Shared decision making and the patient had decided to do Colonoscopy due 2023.   Social History   Tobacco Use  Smoking Status Never  Smokeless Tobacco Never    Lung Cancer Screening (Ages 79-39): not applicable   Weight Wt Readings from Last 3 Encounters:  02/26/22 117 lb (53.1 kg)  06/12/21 119 lb 12.8 oz (54.3 kg)  02/12/21 118 lb 12 oz (53.9 kg)   Patient has normal BMI  BMI Readings from Last 1 Encounters:  02/26/22 22.11 kg/m     Chronic disease screening Blood pressure monitoring:  BP Readings from Last 3 Encounters:  02/26/22 118/80  06/12/21 137/87  02/12/21 (!) 84/62    Lipid Monitoring: Indication for screening: age >89, obesity, diabetes, family hx, CV risk factors.  Lipid screening: Yes  Lab Results  Component Value Date   CHOL 169 02/12/2021   HDL 74.10 02/12/2021   LDLCALC 87 02/12/2021   TRIG 37.0 02/12/2021   CHOLHDL 2 02/12/2021     Diabetes Screening: age >23, overweight, family hx, PCOS, hx of gestational diabetes, at risk ethnicity Diabetes Screening screening: Yes  No results found for: "HGBA1C"  Past Medical History:  Diagnosis Date   Allergy    Asthma    mild exercise induced   Eczema    IUD (intrauterine device) in place    NSVD (normal spontaneous vaginal delivery)     Past Surgical History:  Procedure Laterality Date   AUGMENTATION MAMMAPLASTY Bilateral 03/01/2020   CESAREAN SECTION      Prior to Admission medications   Medication Sig Start Date End Date Taking? Authorizing Provider  albuterol [facility] HFA) 108 (90 Base) MCG/ACT inhaler One puff as needed prior to exercise 02/20/21  Yes Lesleigh Noe, MD  levonorgestrel (MIRENA) 20 MCG/24HR IUD Mirena 20 mcg/24  hours (5 yrs) 52 mg intrauterine device  Take 1 device by intrauterine route.   Yes [provider]  nitrofurantoin (MACRODANTIN) 50 MG capsule Take 50 mg by mouth as needed. 10/30/20  Yes [provider]    No Known Allergies  Gynecologic History: No LMP recorded. (Menstrual status: IUD).  Obstetric History: No obstetric history on file.  Social History   Socioeconomic History   Marital status: Legally Separated    Spouse name: Gwyndolyn Saxon   Number of children: 2   Years of education: Masters Degree   Highest education level: Not on file  Occupational History   Not on file  Tobacco Use   Smoking status: Never   Smokeless tobacco: Never  Substance and Sexual Activity   Alcohol use: Yes    Comment: 1-2 drinks a week   Drug use: No   Sexual activity: Yes    Birth control/protection: I.U.D.  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 [place]   Enjoys: exercise, yoga, fishing, [place]   Social support: friends, therapist   Social Determinants of Health   Financial Resource Strain: [place]  (09/08/2018)   Overall Financial Resource Strain (CARDIA)    Difficulty of Paying Living Expenses: Not hard at all  Food Insecurity: Not on file  Transportation Needs: Not on file  Physical Activity: Not on file  Stress: Not on file  Social Connections: Not on file  Intimate Partner Violence: Not on file    Family History  Problem Relation Age of Onset   Hypertension Mother        lost weight an no longer needs medicine   Hypothyroidism Mother    Heart disease Father        cabg   Hyperlipidemia Father    Hypertension Father     Breast cancer Paternal Grandmother        25s   Heart failure Paternal Grandmother    CAD Paternal Grandfather    Heart attack Paternal Grandfather 81   Leukemia Paternal Grandfather    Colon cancer Neg Hx    Esophageal cancer Neg Hx    Rectal cancer Neg Hx    Stomach cancer Neg Hx     Review of Systems  Constitutional:  Negative for chills and fever.  HENT:  Negative for congestion and sore throat.   Eyes:  Negative for blurred vision and double vision.  Respiratory:  Negative for shortness of breath.  Cardiovascular:  Negative for chest pain.  Gastrointestinal:  Negative for heartburn, nausea and vomiting.  Genitourinary: Negative.   Musculoskeletal: Negative.  Negative for myalgias.  Skin:  Negative for rash.  Neurological:  Negative for dizziness and headaches.  Endo/Heme/Allergies:  Does not bruise/bleed easily.  Psychiatric/Behavioral:  Negative for depression. The patient is not nervous/anxious.      Physical Exam BP 118/80   Pulse 71   Temp (!) 97.5 F (36.4 C) (Temporal)   Ht _0  (1.549 m)   Wt 117 lb (53.1 kg)   SpO2 97%   BMI 22.11 kg/m    BP Readings from Last 3 Encounters:  02/26/22 118/80  06/12/21 137/87  02/12/21 (!) 84/62      Physical Exam Constitutional:      General: She is not in acute distress.    Appearance: She is well-developed. She is not diaphoretic.  HENT:     Head: Normocephalic and atraumatic.     Right Ear: External ear normal.     Left Ear: External ear normal.     Nose: Nose normal.  Eyes:     General: No scleral icterus.    Extraocular Movements: Extraocular movements intact.     Conjunctiva/sclera: Conjunctivae normal.  Cardiovascular:     Rate and Rhythm: Normal rate and regular rhythm.     Heart sounds: No murmur heard. Pulmonary:     Effort: Pulmonary effort is normal. No respiratory distress.     Breath sounds: Normal breath sounds. No wheezing.  Abdominal:     General: Bowel sounds are normal. There is no  distension.     Palpations: Abdomen is soft. There is no mass.     Tenderness: There is no abdominal tenderness. There is no guarding or rebound.  Musculoskeletal:        General: Normal range of motion.     Cervical back: Neck supple.  Lymphadenopathy:     Cervical: No cervical adenopathy.  Skin:    General: Skin is warm and dry.     Capillary Refill: Capillary refill takes less than 2 seconds.  Neurological:     Mental Status: She is alert and oriented to person, place, and time.     Deep Tendon Reflexes: Reflexes normal.  Psychiatric:        Mood and Affect: Mood normal.        Behavior: Behavior normal.     Results:  PHQ-9:  [place] Office Visit from 02/26/2022 in [place] at [place]  PHQ-9 Total Score 2         Assessment: 51 y.o. No obstetric history on file. female here for routine annual physical examination.  Plan: Problem List Items Addressed This Visit       Respiratory   Mild exercise-induced asthma   Relevant Medications   albuterol (PROAIR HFA) 108 (90 Base) MCG/ACT inhaler   Other Visit Diagnoses     Annual physical exam    -  Primary   Screening for hyperlipidemia       Relevant Orders   Lipid panel   Screening for diabetes mellitus       Relevant Orders   Glucose, random       Screening: -- Blood pressure screen normal -- cholesterol screening: will obtain -- Weight screening: normal -- Diabetes Screening: will obtain -- Nutrition: Encouraged healthy diet  The 10-year ASCVD risk score (Arnett DK, et al., 2019) is: 0.7%   Values used to calculate the score:  Age: 58 years     Sex: Female     Is Non-Hispanic African American: No     Diabetic: No     Tobacco smoker: No     Systolic Blood Pressure: 601 mmHg     Is BP treated: No     HDL Cholesterol: 74.1 mg/dL     Total Cholesterol: 169 mg/dL  -- Statin therapy for Age 73-75 with CVD risk >7.5%  Psych -- Depression screening (PHQ-9):  [place] Visit from 02/26/2022 in [place] at [place]  PHQ-9 Total Score 2        Safety -- tobacco screening: not using -- alcohol screening:  low-risk usage. -- no evidence of domestic violence or intimate partner violence.   Cancer Screening -- pap smear not collected per ASCCP guidelines -- family history of breast cancer screening: done. not at high risk. -- Mammogram -  up to date -- Colon cancer (age 8+)--  due in December  Immunizations Immunization History  Administered Date(s) Administered   Influenza Inj Mdck Quad Pf 05/27/2019   Moderna Sars-Covid-2 Vaccination 09/16/2019, 10/14/2019   PFIZER Comirnaty(Gray Top)Covid-19 Tri-Sucrose Vaccine 12/11/2020   Tdap 09/01/2012   Typhoid Inactivated 04/05/2013    -- flu vaccine not in season -- TDAP q10 years up to date -- Shingles (age >43) not up to date - she will get this fall -- Covid-19 Vaccine up to date   Encouraged healthy diet and exercise. Encouraged regular vision and dental care.    Lesleigh Noe, MD

## 2022-06-04 ENCOUNTER — Encounter: Payer: Self-pay | Admitting: Gastroenterology

## 2022-07-23 ENCOUNTER — Ambulatory Visit (AMBULATORY_SURGERY_CENTER): Payer: Self-pay | Admitting: *Deleted

## 2022-07-23 VITALS — Ht 61.0 in | Wt 123.2 lb

## 2022-07-23 DIAGNOSIS — Z86010 Personal history of colonic polyps: Secondary | ICD-10-CM

## 2022-07-23 MED ORDER — NA SULFATE-K SULFATE-MG SULF 17.5-3.13-1.6 GM/177ML PO SOLN: 1.0000 | Freq: Once | ORAL | 0 refills | Status: AC

## 2022-07-23 NOTE — Progress Notes (Signed)
No egg or soy allergy known to patient  PONV after breast augmentation Patient denies ever being told they had issues or difficulty with intubation  No FH of Malignant Hyperthermia Pt is not on diet pills Pt is not on  home 02  Pt is not on blood thinners  Pt denies issues with constipation  Pt encouraged to use to use Singlecare or Goodrx to reduce cost  Patient's chart reviewed by Osvaldo Angst CNRA prior to previsit and patient appropriate for the [place].  Previsit completed and red dot placed by patient's name on their procedure day (on provider's schedule).

## 2022-08-19 ENCOUNTER — Encounter: Payer: Self-pay | Admitting: Gastroenterology

## 2022-08-21 ENCOUNTER — Ambulatory Visit (AMBULATORY_SURGERY_CENTER): Payer: Commercial Managed Care - PPO | Admitting: Gastroenterology

## 2022-08-21 ENCOUNTER — Encounter: Payer: Self-pay | Admitting: Gastroenterology

## 2022-08-21 VITALS — BP 107/71 | HR 75 | Temp 96.2°F | Resp 11 | Ht 61.0 in | Wt 123.2 lb

## 2022-08-21 DIAGNOSIS — Z1211 Encounter for screening for malignant neoplasm of colon: Secondary | ICD-10-CM

## 2022-08-21 DIAGNOSIS — K648 Other hemorrhoids: Secondary | ICD-10-CM | POA: Diagnosis not present

## 2022-08-21 DIAGNOSIS — K635 Polyp of colon: Secondary | ICD-10-CM | POA: Diagnosis not present

## 2022-08-21 DIAGNOSIS — D123 Benign neoplasm of transverse colon: Secondary | ICD-10-CM

## 2022-08-21 DIAGNOSIS — Z86010 Personal history of colonic polyps: Secondary | ICD-10-CM

## 2022-08-21 DIAGNOSIS — Z09 Encounter for follow-up examination after completed treatment for conditions other than malignant neoplasm: Secondary | ICD-10-CM | POA: Diagnosis present

## 2022-08-21 MED ORDER — SODIUM CHLORIDE 0.9 % IV SOLN: 500.0000 mL | Freq: Once | INTRAVENOUS | Status: DC

## 2022-08-21 NOTE — Op Note (Signed)
[place] Patient Name: Cindy Gross Procedure Date: 08/21/2022 9:22 AM MRN: [id number] Endoscopist: Remo Lipps P. Havery Moros , MD, [id number] Age: 51 Referring MD:  Date of Birth: 1971-07-04 Gender: Female Account #: [id number] Procedure:                Colonoscopy Indications:              High risk colon cancer surveillance: Personal                            history of colonic polyps - last exam 08/2019 - 1                            cm sessile serrated polyp removed as well as other                            adenoma Medicines:                Monitored Anesthesia Care Procedure:                Pre-Anesthesia Assessment:                           - Prior to the procedure, a History and Physical                            was performed, and patient medications and                            allergies were reviewed. The patient's tolerance of                            previous anesthesia was also reviewed. The risks                            and benefits of the procedure and the sedation                            options and risks were discussed with the patient.                            All questions were answered, and informed consent                            was obtained. Prior Anticoagulants: The patient has                            taken no anticoagulant or antiplatelet agents. ASA                            Grade Assessment: II - A patient with mild systemic                            disease. After reviewing the risks and benefits,  the patient was deemed in satisfactory condition to                            undergo the procedure.                           After obtaining informed consent, the colonoscope                            was passed under direct vision. Throughout the                            procedure, the patient's blood pressure, pulse, and                            oxygen saturations were monitored  continuously. The                            CF [id number] #[id number] was introduced through the anus                            and advanced to the the cecum, identified by                            appendiceal orifice and ileocecal valve. The                            colonoscopy was performed without difficulty. The                            patient tolerated the procedure well. The quality                            of the bowel preparation was good. The ileocecal                            valve, appendiceal orifice, and rectum were                            photographed. Scope In: 9:28:58 AM Scope Out: 9:44:55 AM Scope Withdrawal Time: 0 hours 12 minutes 17 seconds  Total Procedure Duration: 0 hours 15 minutes 57 seconds  Findings:                 The perianal and digital rectal examinations were                            normal.                           A 4 mm polyp was found in the transverse colon. The                            polyp was flat. The polyp was removed with a cold  snare. Resection and retrieval were complete.                           Internal hemorrhoids were found during retroflexion.                           The exam was otherwise without abnormality. Complications:            No immediate complications. Estimated blood loss:                            Minimal. Estimated Blood Loss:     Estimated blood loss was minimal. Impression:               - One 4 mm polyp in the transverse colon, removed                            with a cold snare. Resected and retrieved.                           - Internal hemorrhoids.                           - The examination was otherwise normal. Recommendation:           - Patient has a contact number available for                            emergencies. The signs and symptoms of potential                            delayed complications were discussed with the                            patient.  Return to normal activities tomorrow.                            Written discharge instructions were provided to the                            patient.                           - Resume previous diet.                           - Continue present medications.                           - Await pathology results. Anticipate repeat                            colonoscopy in 5 years given advanced polyp removed                            on the last exam Cindy Gross P. Cindy Boyland, MD 08/21/2022 9:49:09 AM This report has been signed  electronically.

## 2022-08-21 NOTE — Progress Notes (Signed)
Called to room to assist during endoscopic procedure.  Patient ID and intended procedure confirmed with present staff. Received instructions for my participation in the procedure from the performing physician.  

## 2022-08-21 NOTE — Progress Notes (Signed)
Pt's states no medical or surgical changes since previsit or office visit. 

## 2022-08-21 NOTE — Progress Notes (Signed)
[facility] History and Physical   Primary Care Physician:  Girtha Rm, NP-C   Reason for Procedure:   History of colon polyps  Plan:    colonoscopy     HPI: Cindy Gross is a 51 y.o. female  here for colonoscopy surveillance - last exam 08/2019 with 1 cm SSP and other adenoma. Patient denies any bowel symptoms at this time. No family history of colon cancer known. Otherwise feels well without any cardiopulmonary symptoms.   I have discussed risks / benefits of anesthesia and endoscopic procedure with Lutricia Feil and they wish to proceed with the exams as outlined today.    Past Medical History:  Diagnosis Date   Allergy    Asthma    mild exercise induced   Eczema    IUD (intrauterine device) in place    NSVD (normal spontaneous vaginal delivery)     Past Surgical History:  Procedure Laterality Date   AUGMENTATION MAMMAPLASTY Bilateral 03/01/2020   CESAREAN SECTION     COLONOSCOPY      Prior to Admission medications   Medication Sig Start Date End Date Taking? Authorizing Provider  albuterol [facility] HFA) 108 (90 Base) MCG/ACT inhaler One puff as needed prior to exercise 02/26/22   Waunita Schooner, MD  levonorgestrel (MIRENA) 20 MCG/24HR IUD Mirena 20 mcg/24 hours (5 yrs) 52 mg intrauterine device  Take 1 device by intrauterine route.    [provider]  nitrofurantoin (MACRODANTIN) 50 MG capsule Take 50 mg by mouth as needed. 10/30/20   [provider]    Current Outpatient Medications  Medication Sig Dispense Refill   albuterol (PROAIR HFA) 108 (90 Base) MCG/ACT inhaler One puff as needed prior to exercise 1 each 1   levonorgestrel (MIRENA) 20 MCG/24HR IUD Mirena 20 mcg/24 hours (5 yrs) 52 mg intrauterine device  Take 1 device by intrauterine route.     nitrofurantoin (MACRODANTIN) 50 MG capsule Take 50 mg by mouth as needed.     Current Facility-Administered Medications  Medication Dose Route Frequency Provider  Last Rate Last Admin   0.9 %  sodium chloride infusion  500 mL Intravenous Once Nhung Danko, Carlota Raspberry, MD        Allergies as of 08/21/2022   (No Known Allergies)    Family History  Problem Relation Age of Onset   Hypertension Mother        lost weight an no longer needs medicine   Hypothyroidism Mother    Heart disease Father        cabg   Hyperlipidemia Father    Hypertension Father    Breast cancer Paternal Grandmother        76s   Heart failure Paternal Grandmother    CAD Paternal Grandfather    Heart attack Paternal Grandfather 78   Leukemia Paternal Grandfather    Colon cancer Neg Hx    Esophageal cancer Neg Hx    Rectal cancer Neg Hx    Stomach cancer Neg Hx     Social History   Socioeconomic History   Marital status: Legally Separated    Spouse name: Gwyndolyn Saxon   Number of children: 2   Years of education: Masters Degree   Highest education level: Not on file  Occupational History   Not on file  Tobacco Use   Smoking status: Never   Smokeless tobacco: Never  Vaping Use   Vaping Use: Never used  Substance and Sexual Activity   Alcohol use: Yes  Comment: 1-2 drinks a week   Drug use: No   Sexual activity: Yes    Birth control/protection: I.U.D.  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  Physical Exam: Vital signs BP 93/64   Pulse 75   Temp (!) 96.2 F (35.7 C) (Temporal)   Ht _0  (1.549 m)   Wt 123 lb 3.2 oz (55.9 kg)   SpO2 98%   BMI 23.28 kg/m   General:   Alert,  Well-developed, pleasant and cooperative in NAD Lungs:  Clear throughout to auscultation.   Heart:  Regular rate and rhythm Abdomen:  Soft, nontender and nondistended.   Neuro/Psych:  Alert and cooperative. Normal mood and affect. A and O x 3  Jolly Mango, MD [facility]

## 2022-08-21 NOTE — Patient Instructions (Signed)
Handout on polys given to you today Await pathology results   YOU HAD AN ENDOSCOPIC PROCEDURE TODAY AT [place]:   Refer to the procedure report that was given to you for any specific questions about what was found during the examination.  If the procedure report does not answer your questions, please call your gastroenterologist to clarify.  If you requested that your care partner not be given the details of your procedure findings, then the procedure report has been included in a sealed envelope for you to review at your convenience later.  YOU SHOULD EXPECT: Some feelings of bloating in the abdomen. Passage of more gas than usual.  Walking can help get rid of the air that was put into your GI tract during the procedure and reduce the bloating. If you had a lower endoscopy (such as a colonoscopy or flexible sigmoidoscopy) you may notice spotting of blood in your stool or on the toilet paper. If you underwent a bowel prep for your procedure, you may not have a normal bowel movement for a few days.  Please Note:  You might notice some irritation and congestion in your nose or some drainage.  This is from the oxygen used during your procedure.  There is no need for concern and it should clear up in a day or so.  SYMPTOMS TO REPORT IMMEDIATELY:  Following lower endoscopy (colonoscopy or flexible sigmoidoscopy):  Excessive amounts of blood in the stool  Significant tenderness or worsening of abdominal pains  Swelling of the abdomen that is new, acute  Fever of 100F or higher  For urgent or emergent issues, a gastroenterologist can be reached at any hour by calling [phone]. Do not use MyChart messaging for urgent concerns.    DIET:  We do recommend a small meal at first, but then you may proceed to your regular diet.  Drink plenty of fluids but you should avoid alcoholic beverages for 24 hours.  ACTIVITY:  You should plan to take it easy for the rest of today and you  should NOT DRIVE or use heavy machinery until tomorrow (because of the sedation medicines used during the test).    FOLLOW UP: Our staff will call the number listed on your records the next business day following your procedure.  We will call around 7:15- 8:00 am to check on you and address any questions or concerns that you may have regarding the information given to you following your procedure. If we do not reach you, we will leave a message.     If any biopsies were taken you will be contacted by phone or by letter within the next 1-3 weeks.  Please call us at [phone] if you have not heard about the biopsies in 3 weeks.    SIGNATURES/CONFIDENTIALITY: You and/or your care partner have signed paperwork which will be entered into your electronic medical record.  These signatures attest to the fact that that the information above on your After Visit Summary has been reviewed and is understood.  Full responsibility of the confidentiality of this discharge information lies with you and/or your care-partner.

## 2022-08-21 NOTE — Progress Notes (Signed)
VSS, transported to PACU °

## 2022-08-22 ENCOUNTER — Ambulatory Visit (INDEPENDENT_AMBULATORY_CARE_PROVIDER_SITE_OTHER): Payer: Commercial Managed Care - PPO | Admitting: Family Medicine

## 2022-08-22 ENCOUNTER — Encounter: Payer: Self-pay | Admitting: Family Medicine

## 2022-08-22 ENCOUNTER — Telehealth: Payer: Self-pay

## 2022-08-22 VITALS — BP 126/70 | HR 65 | Temp 97.6°F | Ht 61.0 in | Wt 121.0 lb

## 2022-08-22 DIAGNOSIS — Z7689 Persons encountering health services in other specified circumstances: Secondary | ICD-10-CM

## 2022-08-22 DIAGNOSIS — J302 Other seasonal allergic rhinitis: Secondary | ICD-10-CM | POA: Diagnosis not present

## 2022-08-22 DIAGNOSIS — J45990 Exercise induced bronchospasm: Secondary | ICD-10-CM | POA: Diagnosis not present

## 2022-08-22 DIAGNOSIS — Z23 Encounter for immunization: Secondary | ICD-10-CM | POA: Diagnosis not present

## 2022-08-22 DIAGNOSIS — N390 Urinary tract infection, site not specified: Secondary | ICD-10-CM | POA: Diagnosis not present

## 2022-08-22 MED ORDER — ALBUTEROL SULFATE HFA 108 (90 BASE) MCG/ACT IN AERS: INHALATION_SPRAY | RESPIRATORY_TRACT | 1 refills | Status: DC

## 2022-08-22 NOTE — Patient Instructions (Signed)
Thank you for trusting [place] with your healthcare.   Follow up when you are due for your annual preventive healthcare visit or as needed.

## 2022-08-22 NOTE — Progress Notes (Signed)
New Patient Office Visit  Subjective    Patient ID: Cindy Gross, female    DOB: 1971-04-20  Age: 51 y.o. MRN: [id number]  CC:  Chief Complaint  Patient presents with   Establish Care    No concerns, old PCP moved    HPI Cindy Gross presents to establish care Previous PCP: Waunita Schooner, MD  Other providers: OB/GYN- Dr. Carlis Abbott  GI- Dr. Havery Moros  [facility]   Eczema as a child- JAK inhibitor cleared it up. Stopped in 2021 and no recurrence.   Colonoscopy yesterday. 5 yr recall due to polyps   Has IUD  Exercise induced asthma- uses albuterol prn Other triggers include: seasonal allergies and extreme temps   Denies fever, chills, dizziness, chest pain, palpitations, shortness of breath, abdominal pain, N/V/D, urinary symptoms, LE edema.      Outpatient Encounter Medications as of 08/22/2022  Medication Sig   levonorgestrel (MIRENA) 20 MCG/24HR IUD Mirena 20 mcg/24 hours (5 yrs) 52 mg intrauterine device  Take 1 device by intrauterine route.   nitrofurantoin (MACRODANTIN) 50 MG capsule Take 50 mg by mouth as needed.   [DISCONTINUED] albuterol (PROAIR HFA) 108 (90 Base) MCG/ACT inhaler One puff as needed prior to exercise   albuterol (PROAIR HFA) 108 (90 Base) MCG/ACT inhaler One puff as needed prior to exercise   No facility-administered encounter medications on file as of 08/22/2022.    Past Medical History:  Diagnosis Date   Allergy    Asthma    mild exercise induced   Eczema    IUD (intrauterine device) in place    NSVD (normal spontaneous vaginal delivery)     Past Surgical History:  Procedure Laterality Date   AUGMENTATION MAMMAPLASTY Bilateral 03/01/2020   CESAREAN SECTION     COLONOSCOPY      Family History  Problem Relation Age of Onset   Hypertension Mother        lost weight an no longer needs medicine   Hypothyroidism Mother    Heart disease Father        cabg   Hyperlipidemia Father     Hypertension Father    Breast cancer Paternal Grandmother        23s   Heart failure Paternal Grandmother    CAD Paternal Grandfather    Heart attack Paternal Grandfather 49   Leukemia Paternal Grandfather    Colon cancer Neg Hx    Esophageal cancer Neg Hx    Rectal cancer Neg Hx    Stomach cancer Neg Hx     Social History   Socioeconomic History   Marital status: Legally Separated    Spouse name: Gwyndolyn Saxon   Number of children: 2   Years of education: Masters Degree   Highest education level: Not on file  Occupational History   Not on file  Tobacco Use   Smoking status: Never   Smokeless tobacco: Never  Vaping Use   Vaping Use: Never used  Substance and Sexual Activity   Alcohol use: Yes    Comment: 1-2 drinks a week   Drug use: No   Sexual activity: Yes    Birth control/protection: I.U.D.  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 [place]   Enjoys: exercise, yoga, fishing, [place]   Social support: friends, therapist   Social Determinants of Health   Financial Resource Strain: Low Risk  (09/08/2018)   Overall Financial Resource Strain (CARDIA)    Difficulty of Paying Living Expenses: Not hard at all  Food Insecurity: Not on file  Transportation Needs: Not on file  Physical Activity: Not on file  Stress: Not on file  Social Connections: Not on file  Intimate Partner Violence: Not on file    ROS      Objective    BP 126/70 (BP Location: Left Arm, Patient Position: Sitting, Cuff Size: Large)   Pulse 65   Temp 97.6 F (36.4 C) (Temporal)   Ht _0  (1.549 m)   Wt 121 lb (54.9 kg)   SpO2 99%   BMI 22.86 kg/m    Physical Exam Constitutional:      General: She is not in acute distress.    Appearance: She is not ill-appearing.  Cardiovascular:     Rate and Rhythm: Normal rate and regular rhythm.  Pulmonary:     Effort: Pulmonary effort is normal.     Breath sounds: Normal breath sounds.  Skin:    General: Skin is warm and dry.  Neurological:     General: No focal deficit present.     Mental Status: She is alert and oriented to person, place, and time.  Psychiatric:        Mood and Affect: Mood normal.        Behavior: Behavior normal.        Thought Content: Thought content normal.         Assessment & Plan:   Problem List Items Addressed This Visit       Respiratory   Mild exercise-induced asthma - Primary   Relevant Medications   albuterol (PROAIR HFA) 108 (90 Base) MCG/ACT inhaler     Genitourinary   Postcoital UTI     Other   Seasonal allergies   Other Visit Diagnoses     Encounter to establish care       Need for Tdap vaccination       Relevant Orders   Tdap vaccine greater than or equal to 7yo IM (Completed)      She is a pleasant 51 year old female who is new to the practice and here to establish care.  Refilled albuterol inhaler.  Asthma is well-controlled.  Continue using inhaler to premedicate with exercise as needed. Tdap given and discussed potential side effects. Reviewed recent labs and results from OB/GYN. Follow-up as needed  Return for when due for CPE or as needed .   Harland Dingwall, NP-C

## 2022-08-22 NOTE — Telephone Encounter (Signed)
  Follow up Call-     08/21/2022    8:29 AM  Call back number  Post procedure Call Back phone  # [phone]  Permission to leave phone message Yes     Patient questions:  Do you have a fever, pain , or abdominal swelling? No. Pain Score  0 *  Have you tolerated food without any problems? Yes.    Have you been able to return to your normal activities? Yes.    Do you have any questions about your discharge instructions: Diet   No. Medications  No. Follow up visit  No.  Do you have questions or concerns about your Care? No.  Actions: * If pain score is 4 or above: No action needed, pain <4.

## 2022-10-09 ENCOUNTER — Other Ambulatory Visit: Payer: Self-pay | Admitting: Obstetrics

## 2022-10-09 DIAGNOSIS — Z1231 Encounter for screening mammogram for malignant neoplasm of breast: Secondary | ICD-10-CM

## 2022-10-10 IMAGING — MG DIGITAL SCREENING BREAST BILAT IMPLANT W/ TOMO W/ CAD
9 of 12 series · 9 of 28 positions shown · non-contrast
Comparison: Previous exam(s).

CLINICAL DATA: Screening.

EXAM:
DIGITAL SCREENING BILATERAL MAMMOGRAM WITH IMPLANTS, CAD AND
TOMOSYNTHESIS
TECHNIQUE: Bilateral screening digital craniocaudal and mediolateral oblique
mammograms were obtained. Bilateral screening digital breast
tomosynthesis was performed. The images were evaluated with
computer-aided detection. Standard and/or implant displaced views
were performed.

[R MLO]
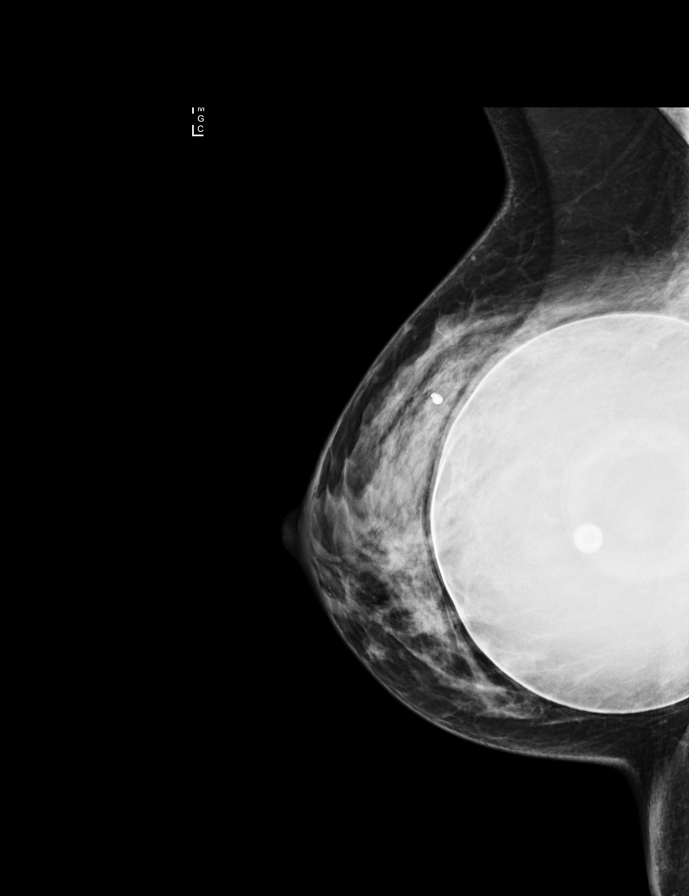

[L CC]
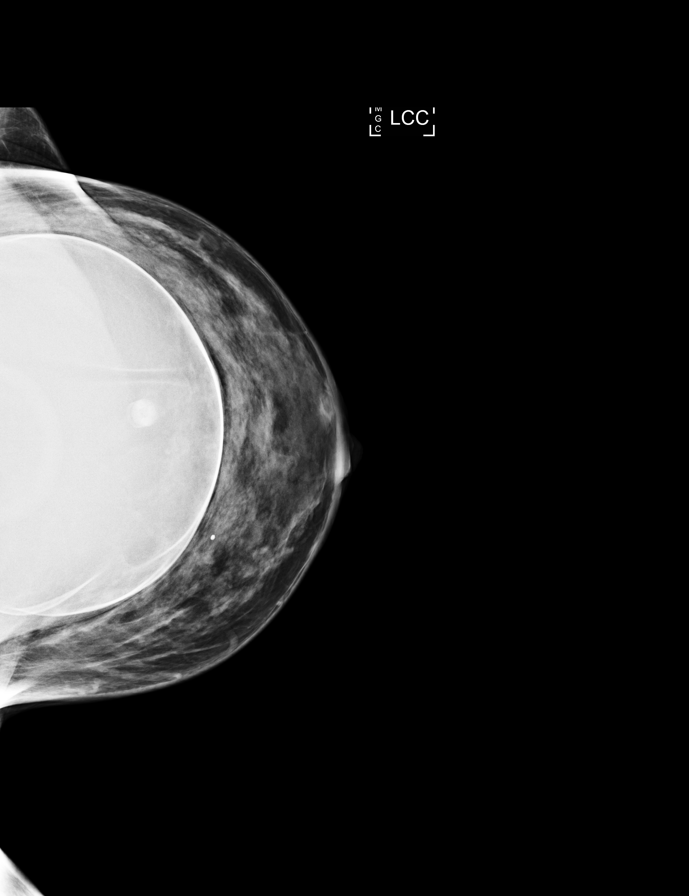

[L MLO]
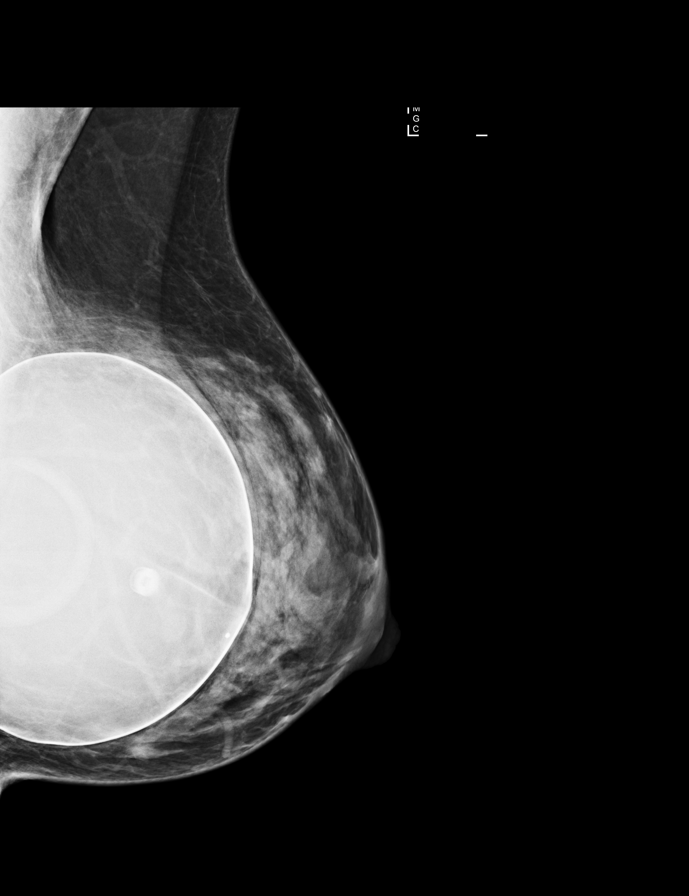

[R CC]
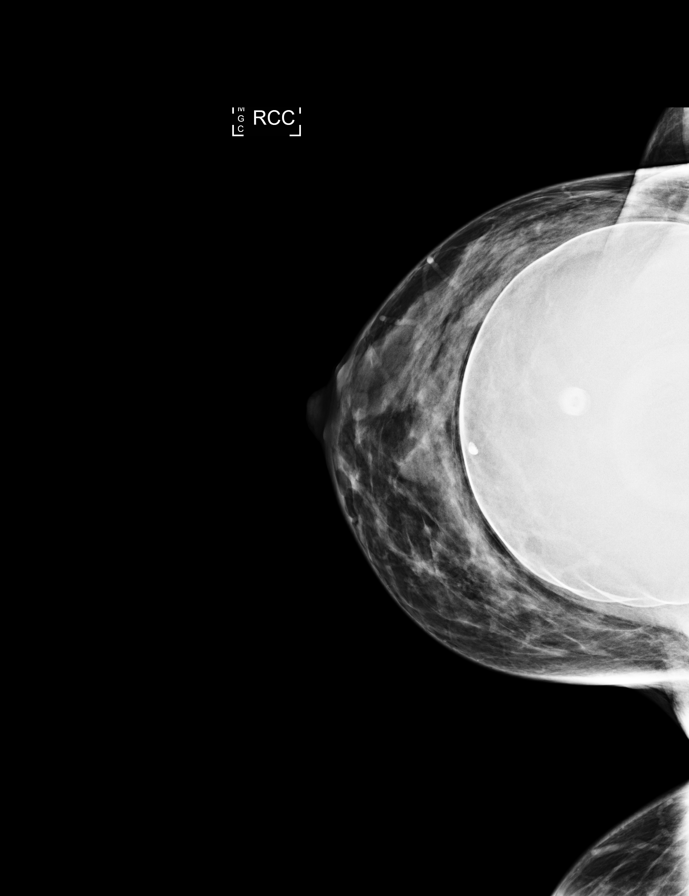

[R MLO synth-2D]
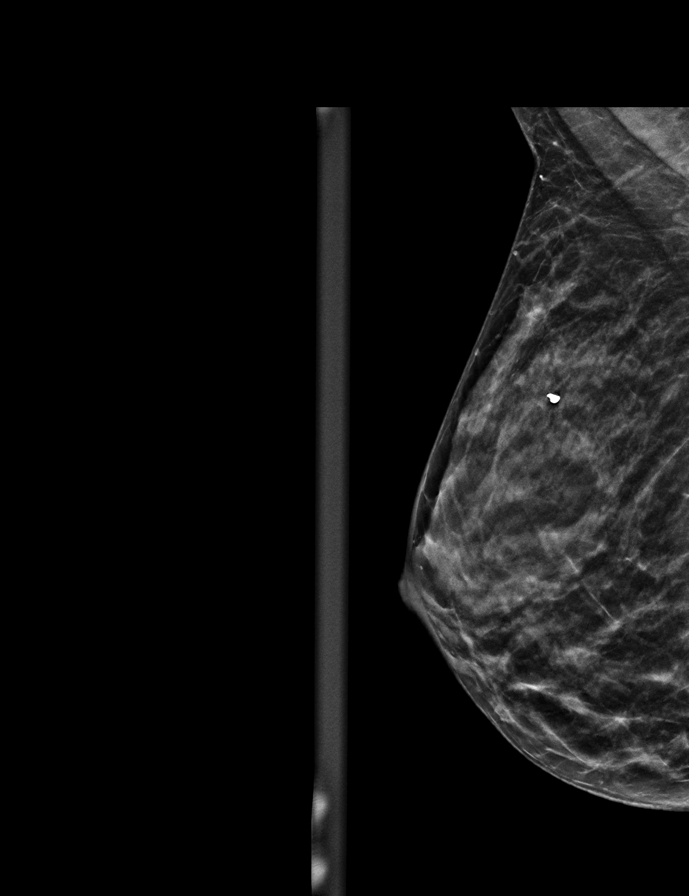

[L MLO synth-2D]
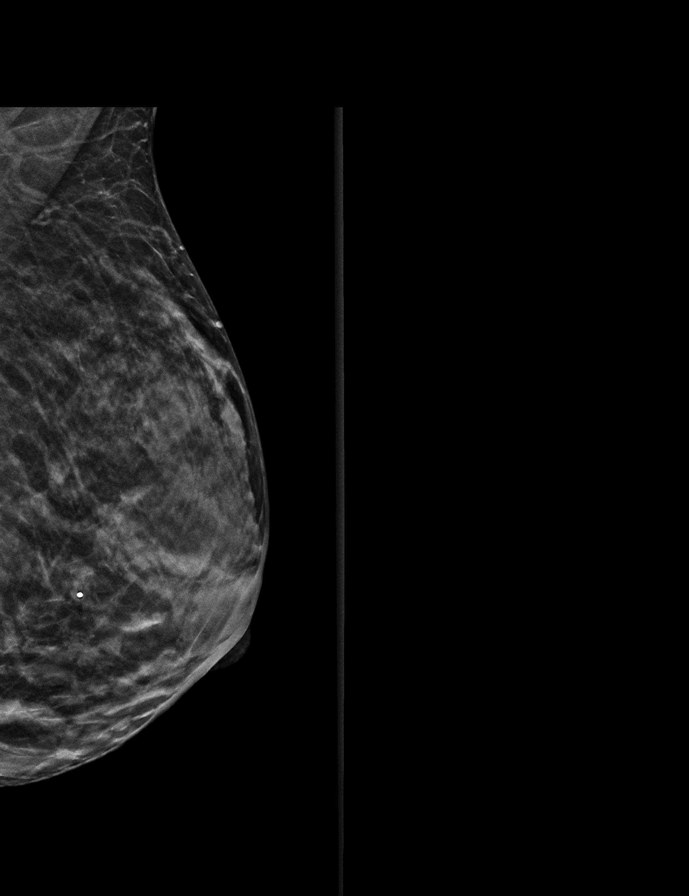

[R CC synth-2D]
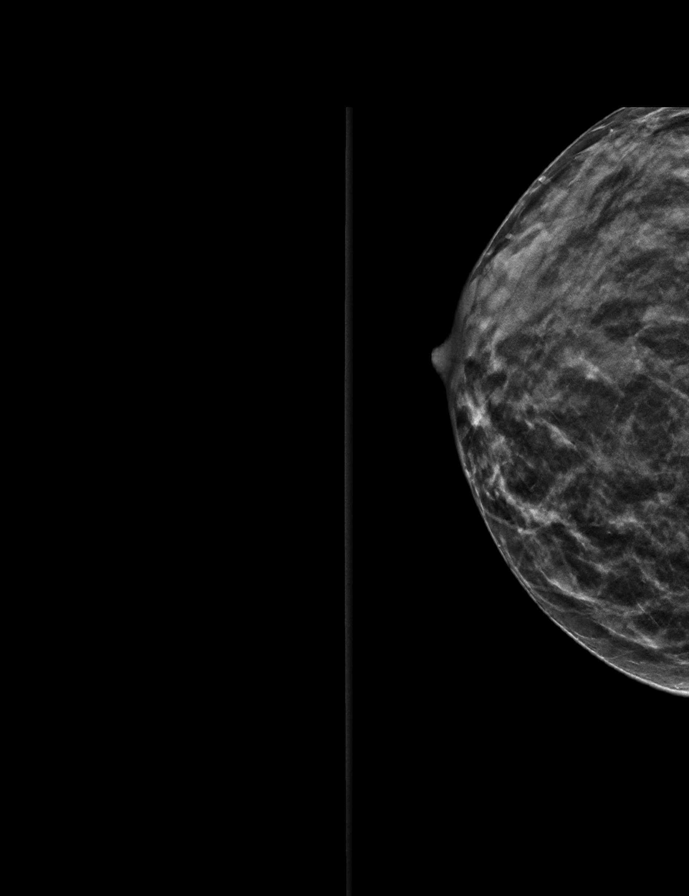

[L CC synth-2D]
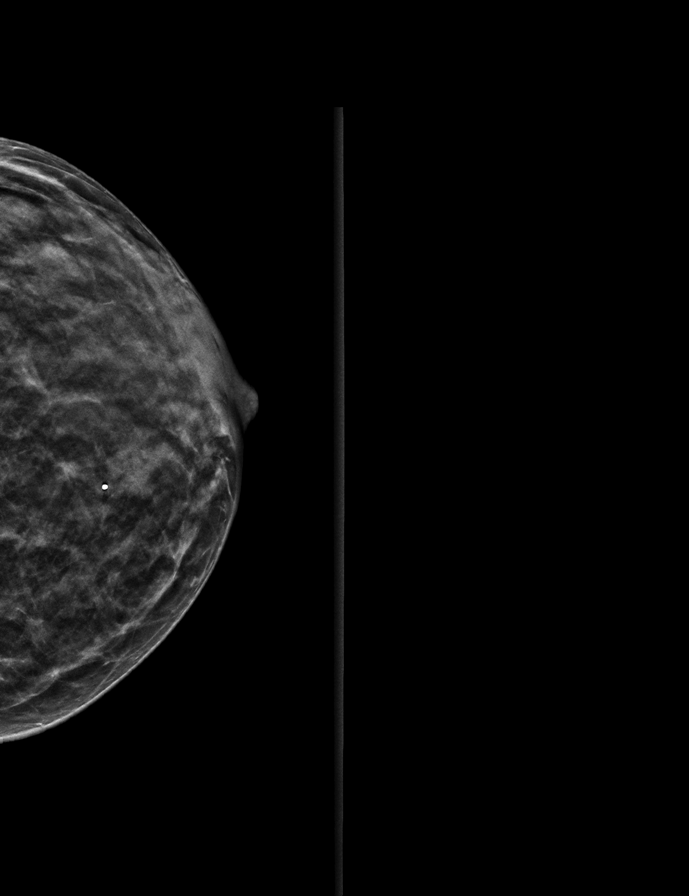

[R CCID BREAST TOMOSYNTHESIS IMAGE tomo · tomo slice 17/34.0]
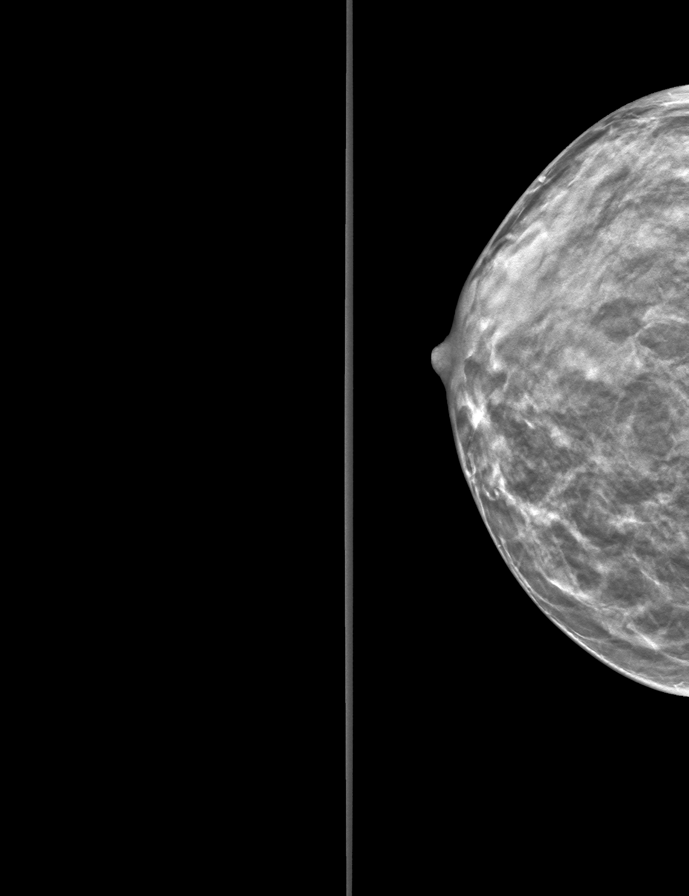

[9 of 28 positions shown; findings below may reference images not displayed]

ACR Breast Density Category c: The breast tissue is heterogeneously
dense, which may obscure small masses.
FINDINGS: The patient has retropectoral implants. There are no findings
suspicious for malignancy.
IMPRESSION: No mammographic evidence of malignancy. A result letter of this
screening mammogram will be mailed directly to the patient.

RECOMMENDATION:
Screening mammogram in one year. (Code:LT-E-7TH)

BI-RADS CATEGORY  1:  Negative.

## 2022-11-07 ENCOUNTER — Ambulatory Visit: Payer: Self-pay | Admitting: Family Medicine

## 2022-12-03 ENCOUNTER — Ambulatory Visit
Admission: RE | Admit: 2022-12-03 | Discharge: 2022-12-03 | Disposition: A | Discharge status: Home / Self Care | Payer: BLUE CROSS/BLUE SHIELD | Source: Ambulatory Visit | Attending: Obstetrics

## 2022-12-03 DIAGNOSIS — Z1231 Encounter for screening mammogram for malignant neoplasm of breast: Secondary | ICD-10-CM

## 2023-04-17 ENCOUNTER — Encounter: Payer: Self-pay | Admitting: Gastroenterology

## 2023-07-22 ENCOUNTER — Encounter: Payer: BLUE CROSS/BLUE SHIELD | Admitting: Family Medicine

## 2023-09-03 ENCOUNTER — Ambulatory Visit: Payer: BC Managed Care – PPO | Admitting: Family Medicine

## 2023-09-03 ENCOUNTER — Encounter: Payer: Self-pay | Admitting: Family Medicine

## 2023-09-03 VITALS — BP 110/62 | HR 85 | Temp 97.6°F | Ht 61.0 in | Wt 126.0 lb

## 2023-09-03 DIAGNOSIS — Z8249 Family history of ischemic heart disease and other diseases of the circulatory system: Secondary | ICD-10-CM

## 2023-09-03 DIAGNOSIS — Z8349 Family history of other endocrine, nutritional and metabolic diseases: Secondary | ICD-10-CM

## 2023-09-03 DIAGNOSIS — Z136 Encounter for screening for cardiovascular disorders: Secondary | ICD-10-CM

## 2023-09-03 DIAGNOSIS — Z0000 Encounter for general adult medical examination without abnormal findings: Secondary | ICD-10-CM

## 2023-09-03 DIAGNOSIS — J45990 Exercise induced bronchospasm: Secondary | ICD-10-CM

## 2023-09-03 DIAGNOSIS — Z13220 Encounter for screening for lipoid disorders: Secondary | ICD-10-CM

## 2023-09-03 DIAGNOSIS — Z0001 Encounter for general adult medical examination with abnormal findings: Secondary | ICD-10-CM

## 2023-09-03 NOTE — Progress Notes (Signed)
 Complete physical exam  Patient: Cindy Gross   DOB: Dec 30, 1970   53 y.o. Female  MRN: [id number]  Subjective:    Chief Complaint  Patient presents with   Annual Exam    Not fasting   She is here for a complete physical exam.  Other providers: OB/GYN- Dr. Gretta  GI- Dr. Leigh  Dermatologist- [facility] dermatologist  Cardiologist - Dr. Ladona in 2017   April training for triathlon  Plant based diet    Asthma- exercise induced  No issues recently   States she has had EKGs in the past due to SVT. No longer an issue.  Wore holter monitor in past.  Never had echo   Father with CAD in mid late 60s with Bypass  Mother has thyroid     Works a PA    Health Maintenance  Topic Date Due   Zoster (Shingles) Vaccine (1 of 2) Never done   COVID-19 Vaccine (4 - 2024-25 season) 09/19/2023*   Mammogram  12/02/2024   Pap with HPV screening  10/26/2025   Colon Cancer Screening  08/22/2027   DTaP/Tdap/Td vaccine (3 - Td or Tdap) 08/22/2032   Flu Shot  Completed   Hepatitis C Screening  Completed   HIV Screening  Completed   HPV Vaccine  Aged Out  *Topic was postponed. The date shown is not the original due date.    Wears seatbelt always, uses sunscreen, smoke detectors in home and functioning, does not text while driving, feels safe in home environment.  Depression screening:    09/03/2023    9:25 AM 08/22/2022    8:31 AM 02/26/2022    8:20 AM  Depression screen PHQ 2/9  Decreased Interest 0 0 0  Down, Depressed, Hopeless 0 0 1  PHQ - 2 Score 0 0 1  Altered sleeping   0  Tired, decreased energy   0  Change in appetite   0  Feeling bad or failure about yourself    1  Trouble concentrating   0  Moving slowly or fidgety/restless   0  Suicidal thoughts   0  PHQ-9 Score   2  Difficult doing work/chores   Not difficult at all   Anxiety Screening:    02/26/2022    8:20 AM  GAD 7 : Generalized Anxiety Score  Nervous, Anxious, on Edge 2  Control/stop  worrying 2  Worry too much - different things 2  Trouble relaxing 1  Restless 0  Easily annoyed or irritable 0  Afraid - awful might happen 1  Total GAD 7 Score 8  Anxiety Difficulty Somewhat difficult    Vision:Within last year and Dental: No current dental problems and Receives regular dental care  Patient Active Problem List   Diagnosis Date Noted   Seasonal allergies 08/22/2022   Postcoital UTI 02/12/2021   Severe eczema 09/08/2018   Routine general medical examination at a health care facility 01/20/2016   Headache 08/23/2014   Mild exercise-induced asthma 04/05/2014   Advance care planning 04/05/2014   Past Medical History:  Diagnosis Date   Allergy    Asthma    mild exercise induced   Eczema    IUD (intrauterine device) in place    NSVD (normal spontaneous vaginal delivery)    Past Surgical History:  Procedure Laterality Date   AUGMENTATION MAMMAPLASTY Bilateral 03/01/2020   CESAREAN SECTION     COLONOSCOPY     Social History   Tobacco Use   Smoking status: Never  Smokeless tobacco: Never  Vaping Use   Vaping status: Never Used  Substance Use Topics   Alcohol use: Yes    Comment: 1-2 drinks a week   Drug use: No      Patient Care Team: Lendia Boby CROME, NP-C as PCP - General (Family Medicine)   Outpatient Medications Prior to Visit  Medication Sig   albuterol  (PROAIR  HFA) 108 (90 Base) MCG/ACT inhaler One puff as needed prior to exercise   estradiol (VIVELLE-DOT) 0.05 MG/24HR patch 1 patch 2 (two) times a week.   levonorgestrel (MIRENA) 20 MCG/24HR IUD Mirena 20 mcg/24 hours (5 yrs) 52 mg intrauterine device  Take 1 device by intrauterine route.   nitrofurantoin (MACRODANTIN) 50 MG capsule Take 50 mg by mouth as needed.   No facility-administered medications prior to visit.    Review of Systems  Constitutional:  Negative for chills, fever and malaise/fatigue.  HENT:  Negative for congestion, ear pain, sinus pain and sore throat.   Eyes:   Negative for blurred vision, double vision and pain.  Respiratory:  Negative for cough, shortness of breath and wheezing.   Cardiovascular:  Negative for chest pain, palpitations and leg swelling.  Gastrointestinal:  Negative for abdominal pain, constipation, diarrhea, nausea and vomiting.  Genitourinary:  Negative for dysuria, frequency and urgency.  Musculoskeletal:  Negative for back pain, joint pain and myalgias.  Skin:  Negative for rash.  Neurological:  Negative for dizziness, tingling, focal weakness and headaches.  Endo/Heme/Allergies:  Does not bruise/bleed easily.  Psychiatric/Behavioral:  Negative for depression. The patient is not nervous/anxious.        Objective:    BP 110/62 (BP Location: Left Arm, Patient Position: Sitting, Cuff Size: Normal)   Pulse 85   Temp 97.6 F (36.4 C) (Temporal)   Ht 5' 1 (1.549 m)   Wt 126 lb (57.2 kg)   SpO2 97%   BMI 23.81 kg/m  BP Readings from Last 3 Encounters:  09/03/23 110/62  08/22/22 126/70  08/21/22 107/71   Wt Readings from Last 3 Encounters:  09/03/23 126 lb (57.2 kg)  08/22/22 121 lb (54.9 kg)  08/21/22 123 lb 3.2 oz (55.9 kg)    Physical Exam Constitutional:      General: She is not in acute distress. HENT:     Right Ear: Tympanic membrane, ear canal and external ear normal.     Left Ear: Tympanic membrane, ear canal and external ear normal.     Nose: Nose normal.     Mouth/Throat:     Mouth: Mucous membranes are moist.     Pharynx: Oropharynx is clear.  Eyes:     Extraocular Movements: Extraocular movements intact.     Conjunctiva/sclera: Conjunctivae normal.     Pupils: Pupils are equal, round, and reactive to light.  Neck:     Thyroid : No thyroid  mass, thyromegaly or thyroid  tenderness.  Cardiovascular:     Rate and Rhythm: Normal rate and regular rhythm.     Pulses: Normal pulses.     Heart sounds: Normal heart sounds.  Pulmonary:     Effort: Pulmonary effort is normal.     Breath sounds: Normal  breath sounds.  Abdominal:     General: Bowel sounds are normal.     Palpations: Abdomen is soft.     Tenderness: There is no abdominal tenderness. There is no right CVA tenderness, left CVA tenderness, guarding or rebound.  Musculoskeletal:        General: Normal range of motion.  Cervical back: Normal range of motion and neck supple. No tenderness.     Right lower leg: No edema.     Left lower leg: No edema.  Lymphadenopathy:     Cervical: No cervical adenopathy.  Skin:    General: Skin is warm and dry.     Findings: No lesion or rash.  Neurological:     General: No focal deficit present.     Mental Status: She is alert and oriented to person, place, and time.     Cranial Nerves: No cranial nerve deficit.     Sensory: No sensory deficit.     Motor: No weakness.     Gait: Gait normal.  Psychiatric:        Mood and Affect: Mood normal.        Behavior: Behavior normal.        Thought Content: Thought content normal.      No results found for any visits on 09/03/23.    Assessment & Plan:    Routine Health Maintenance and Physical Exam  Problem List Items Addressed This Visit     Mild exercise-induced asthma   Other Visit Diagnoses       Encounter for general adult medical examination with abnormal findings    -  Primary   Relevant Orders   CBC with Differential/Platelet   Comprehensive metabolic panel     Family history of thyroid  disease in mother       Relevant Orders   TSH   T4, free     Family history of coronary artery disease in father       Relevant Orders   CT CARDIAC SCORING (SELF PAY ONLY)     Screening for ischemic heart disease       Relevant Orders   CT CARDIAC SCORING (SELF PAY ONLY)     Screening for lipid disorders       Relevant Orders   Lipid panel      Reports being in her usual state of health.  Her mood is good. Up-to-date with OB/GYN and colonoscopy. Due to family history and increasing age, CT coronary calcium score ordered.  She  is in favor of this.  She is a product/process development scientist. Preventive health care reviewed.  Counseling on healthy lifestyle including diet and exercise.  Recommend regular dental and eye exams.  Immunizations reviewed.  Discussed safety. Discussed Shingrix vaccine. She will return tomorrow for fasting labs.   Return in about 1 year (around 09/02/2024).     Boby Mackintosh, NP-C

## 2023-09-03 NOTE — Patient Instructions (Addendum)
 Please return to the lab on the first floor for fasting labs.    Check on Shingrix vaccine. 2 shot series.   Call and schedule your dermatology exam.

## 2023-09-04 ENCOUNTER — Other Ambulatory Visit (INDEPENDENT_AMBULATORY_CARE_PROVIDER_SITE_OTHER): Payer: BC Managed Care – PPO

## 2023-09-04 DIAGNOSIS — Z13220 Encounter for screening for lipoid disorders: Secondary | ICD-10-CM

## 2023-09-04 DIAGNOSIS — Z8349 Family history of other endocrine, nutritional and metabolic diseases: Secondary | ICD-10-CM

## 2023-09-04 DIAGNOSIS — Z0001 Encounter for general adult medical examination with abnormal findings: Secondary | ICD-10-CM

## 2023-09-04 LAB — CBC WITH DIFFERENTIAL/PLATELET
Basophils Absolute: 0.1 10*3/uL (ref 0.0–0.1)
Basophils Relative: 1.4 % (ref 0.0–3.0)
Eosinophils Absolute: 0.2 10*3/uL (ref 0.0–0.7)
Eosinophils Relative: 3.5 % (ref 0.0–5.0)
HCT: 41.7 % (ref 36.0–46.0)
Hemoglobin: 14.2 g/dL (ref 12.0–15.0)
Lymphocytes Relative: 23.3 % (ref 12.0–46.0)
Lymphs Abs: 1.1 10*3/uL (ref 0.7–4.0)
MCHC: 34.1 g/dL (ref 30.0–36.0)
MCV: 100 fL (ref 78.0–100.0)
Monocytes Absolute: 0.5 10*3/uL (ref 0.1–1.0)
Monocytes Relative: 10.1 % (ref 3.0–12.0)
Neutro Abs: 2.9 10*3/uL (ref 1.4–7.7)
Neutrophils Relative %: 61.7 % (ref 43.0–77.0)
Platelets: 204 10*3/uL (ref 150.0–400.0)
RBC: 4.17 Mil/uL (ref 3.87–5.11)
RDW: 12.8 % (ref 11.5–15.5)
WBC: 4.7 10*3/uL (ref 4.0–10.5)

## 2023-09-04 LAB — LIPID PANEL
Cholesterol: 193 mg/dL (ref 0–200)
HDL: 81.4 mg/dL (ref >39.00)
LDL Cholesterol: 104 mg/dL — ABNORMAL HIGH (ref 0–99)
NonHDL: 111.1
Total CHOL/HDL Ratio: 2
Triglycerides: 34 mg/dL (ref 0.0–149.0)
VLDL: 6.8 mg/dL (ref 0.0–40.0)

## 2023-09-04 LAB — COMPREHENSIVE METABOLIC PANEL
ALT: 14 U/L (ref 0–35)
AST: 22 U/L (ref 0–37)
Albumin: 4.3 g/dL (ref 3.5–5.2)
Alkaline Phosphatase: 47 U/L (ref 39–117)
BUN: 12 mg/dL (ref 6–23)
CO2: 24 meq/L (ref 19–32)
Calcium: 9.2 mg/dL (ref 8.4–10.5)
Chloride: 104 meq/L (ref 96–112)
Creatinine, Ser: 0.74 mg/dL (ref 0.40–1.20)
GFR: 91.8 mL/min (ref >60.00)
Glucose, Bld: 95 mg/dL (ref 70–99)
Potassium: 4.1 meq/L (ref 3.5–5.1)
Sodium: 137 meq/L (ref 135–145)
Total Bilirubin: 0.4 mg/dL (ref 0.2–1.2)
Total Protein: 7 g/dL (ref 6.0–8.3)

## 2023-09-04 LAB — T4, FREE: Free T4: 0.86 ng/dL (ref 0.60–1.60)

## 2023-09-04 LAB — TSH: TSH: 1.81 u[IU]/mL (ref 0.35–5.50)

## 2023-10-20 ENCOUNTER — Other Ambulatory Visit: Payer: Self-pay | Admitting: Family Medicine

## 2023-10-20 DIAGNOSIS — Z1231 Encounter for screening mammogram for malignant neoplasm of breast: Secondary | ICD-10-CM

## 2023-10-28 ENCOUNTER — Encounter: Payer: Self-pay | Admitting: Family Medicine

## 2023-10-28 ENCOUNTER — Other Ambulatory Visit: Payer: Self-pay | Admitting: Family Medicine

## 2023-10-28 DIAGNOSIS — J45990 Exercise induced bronchospasm: Secondary | ICD-10-CM

## 2023-10-28 NOTE — Telephone Encounter (Signed)
 Fyi.. ok for this?

## 2023-10-29 ENCOUNTER — Ambulatory Visit (HOSPITAL_COMMUNITY)
Admission: RE | Admit: 2023-10-29 | Discharge: 2023-10-29 | Disposition: A | Discharge status: Home / Self Care | Payer: Self-pay | Source: Ambulatory Visit | Attending: Family Medicine | Admitting: Family Medicine

## 2023-10-29 DIAGNOSIS — Z8249 Family history of ischemic heart disease and other diseases of the circulatory system: Secondary | ICD-10-CM | POA: Insufficient documentation

## 2023-10-29 DIAGNOSIS — Z136 Encounter for screening for cardiovascular disorders: Secondary | ICD-10-CM | POA: Insufficient documentation

## 2023-10-30 ENCOUNTER — Encounter: Payer: Self-pay | Admitting: Family Medicine

## 2023-11-05 ENCOUNTER — Telehealth: Admitting: Family Medicine

## 2023-11-05 DIAGNOSIS — J4521 Mild intermittent asthma with (acute) exacerbation: Secondary | ICD-10-CM | POA: Diagnosis not present

## 2023-11-05 MED ORDER — PREDNISONE 20 MG PO TABS
40.0000 mg | ORAL_TABLET | Freq: Every day | ORAL | 0 refills | Status: AC
Start: 2023-11-05 — End: 2023-11-10

## 2023-11-05 NOTE — Progress Notes (Signed)
 E-Visit for Asthma  Based on what you have shared with me, it looks like you may have a flare up of your asthma.  Asthma is a chronic (ongoing) lung disease which results in airway obstruction, inflammation and hyper-responsiveness.   Asthma symptoms vary from person to person, with common symptoms including nighttime awakening and decreased ability to participate in normal activities as a result of shortness of breath. It is often triggered by changes in weather, changes in the season, changes in air temperature, or inside (home, school, daycare or work) allergens such as animal dander, mold, mildew, woodstoves or cockroaches.   It can also be triggered by hormonal changes, extreme emotion, physical exertion or an upper respiratory tract illness.     It is important to identify the trigger, and then eliminate or avoid the trigger if possible.   If you have been prescribed medications to be taken on a regular basis, it is important to follow the asthma action plan and to follow guidelines to adjust medication in response to increasing symptoms of decreased peak expiratory flow rate  Treatment: I have prescribed: Prednisone 40mg  by mouth per day for 5 - 7 days  HOME CARE Only take medications as instructed by your medical team. Consider wearing a mask or scarf to improve breathing air temperature have been shown to decrease irritation and decrease exacerbations Get rest. Taking a steamy shower or using a humidifier may help nasal congestion sand ease sore throat pain. You can place a towel over your head and breathe in the steam from hot water coming from a faucet. Using a saline nasal spray works much the same way.  Cough drops, hare candies and sore throat lozenges may ease your cough.  Avoid close contacts especially the very you and the elderly Cover your mouth if you cough or  sneeze Always remember to wash your hands.    GET HELP RIGHT AWAY IF: You develop worsening symptoms; breathlessness at rest, drowsy, confused or agitated, unable to speak in full sentences You have coughing fits You develop a severe headache or visual changes You develop shortness of breath, difficulty breathing or start having chest pain Your symptoms persist after you have completed your treatment plan If your symptoms do not improve within 10 days  MAKE SURE YOU Understand these instructions. Will watch your condition. Will get help right away if you are not doing well or get worse.   Your e-visit answers were reviewed by a board certified advanced clinical practitioner to complete your personal care plan, Depending upon the condition, your plan could have included both over the counter or prescription medications.   Please review your pharmacy choice. Your safety is important to [place]. If you have drug allergies check your prescription carefully.  You can use MyChart to ask questions about today's visit, request a non-urgent  call back, or ask for a work or school excuse for 24 hours related to this e-Visit. If it has been greater than 24 hours you will need to follow up with your provider, or enter a new e-Visit to address those concerns.   You will get an e-mail in the next two days asking about your experience. I hope that your e-visit has been valuable and will speed your recovery. Thank you for using e-visits.   I provided 5 minutes of non face-to-face time during this encounter for chart review, medication and order placement, as well as and documentation.

## 2023-12-21 ENCOUNTER — Ambulatory Visit: Payer: BC Managed Care – PPO

## 2023-12-21 ENCOUNTER — Ambulatory Visit
Admission: RE | Admit: 2023-12-21 | Discharge: 2023-12-21 | Disposition: A | Discharge status: Home / Self Care | Source: Ambulatory Visit | Attending: Family Medicine | Admitting: Family Medicine

## 2023-12-21 ENCOUNTER — Other Ambulatory Visit: Payer: Self-pay | Admitting: Family Medicine

## 2023-12-21 DIAGNOSIS — Z1231 Encounter for screening mammogram for malignant neoplasm of breast: Secondary | ICD-10-CM

## 2024-10-26 ENCOUNTER — Encounter: Admitting: Family Medicine
